# Patient Record
Sex: Female | Born: 1975 | Hispanic: No | Marital: Married | Smoking: Current every day smoker
Health system: Southern US, Community
[De-identification: ages and names within clinical notes are randomized; demographics above are authoritative.]

## PROBLEM LIST (undated history)

## (undated) DIAGNOSIS — O009 Unspecified ectopic pregnancy without intrauterine pregnancy: Secondary | ICD-10-CM

## (undated) DIAGNOSIS — F329 Major depressive disorder, single episode, unspecified: Secondary | ICD-10-CM

## (undated) DIAGNOSIS — F32A Depression, unspecified: Secondary | ICD-10-CM

## (undated) DIAGNOSIS — N390 Urinary tract infection, site not specified: Secondary | ICD-10-CM

## (undated) DIAGNOSIS — F419 Anxiety disorder, unspecified: Secondary | ICD-10-CM

## (undated) HISTORY — PX: SALPINGECTOMY: SHX328

---

## 2002-04-08 ENCOUNTER — Emergency Department (HOSPITAL_COMMUNITY): Admission: EM | Admit: 2002-04-08 | Discharge: 2002-04-08 | Payer: Self-pay | Admitting: Emergency Medicine

## 2002-06-08 ENCOUNTER — Encounter: Payer: Self-pay | Admitting: Emergency Medicine

## 2002-06-08 ENCOUNTER — Emergency Department (HOSPITAL_COMMUNITY): Admission: EM | Admit: 2002-06-08 | Discharge: 2002-06-08 | Payer: Self-pay | Admitting: Emergency Medicine

## 2002-12-26 ENCOUNTER — Emergency Department (HOSPITAL_COMMUNITY): Admission: EM | Admit: 2002-12-26 | Discharge: 2002-12-26 | Payer: Self-pay | Admitting: Emergency Medicine

## 2003-01-16 ENCOUNTER — Emergency Department (HOSPITAL_COMMUNITY): Admission: EM | Admit: 2003-01-16 | Discharge: 2003-01-17 | Payer: Self-pay | Admitting: Emergency Medicine

## 2003-04-01 ENCOUNTER — Encounter: Payer: Self-pay | Admitting: Emergency Medicine

## 2003-04-01 ENCOUNTER — Emergency Department (HOSPITAL_COMMUNITY): Admission: EM | Admit: 2003-04-01 | Discharge: 2003-04-01 | Payer: Self-pay | Admitting: Emergency Medicine

## 2003-06-23 ENCOUNTER — Emergency Department (HOSPITAL_COMMUNITY): Admission: EM | Admit: 2003-06-23 | Discharge: 2003-06-23 | Payer: Self-pay | Admitting: Emergency Medicine

## 2003-12-12 ENCOUNTER — Emergency Department (HOSPITAL_COMMUNITY): Admission: EM | Admit: 2003-12-12 | Discharge: 2003-12-13 | Payer: Self-pay | Admitting: Emergency Medicine

## 2004-01-30 ENCOUNTER — Ambulatory Visit (HOSPITAL_COMMUNITY): Admission: RE | Admit: 2004-01-30 | Discharge: 2004-01-30 | Payer: Self-pay | Admitting: Obstetrics and Gynecology

## 2004-11-20 ENCOUNTER — Emergency Department (HOSPITAL_COMMUNITY): Admission: EM | Admit: 2004-11-20 | Discharge: 2004-11-21 | Payer: Self-pay | Admitting: Emergency Medicine

## 2004-11-23 ENCOUNTER — Inpatient Hospital Stay (HOSPITAL_COMMUNITY): Admission: AD | Admit: 2004-11-23 | Discharge: 2004-11-23 | Payer: Self-pay | Admitting: Obstetrics & Gynecology

## 2004-11-24 ENCOUNTER — Inpatient Hospital Stay (HOSPITAL_COMMUNITY): Admission: AD | Admit: 2004-11-24 | Discharge: 2004-11-24 | Payer: Self-pay | Admitting: *Deleted

## 2004-12-07 ENCOUNTER — Other Ambulatory Visit: Admission: RE | Admit: 2004-12-07 | Discharge: 2004-12-07 | Payer: Self-pay | Admitting: Obstetrics and Gynecology

## 2004-12-08 ENCOUNTER — Other Ambulatory Visit: Admission: RE | Admit: 2004-12-08 | Discharge: 2004-12-08 | Payer: Self-pay | Admitting: Obstetrics and Gynecology

## 2004-12-12 ENCOUNTER — Inpatient Hospital Stay (HOSPITAL_COMMUNITY): Admission: AD | Admit: 2004-12-12 | Discharge: 2004-12-12 | Payer: Self-pay | Admitting: Obstetrics & Gynecology

## 2004-12-14 ENCOUNTER — Encounter (INDEPENDENT_AMBULATORY_CARE_PROVIDER_SITE_OTHER): Payer: Self-pay | Admitting: *Deleted

## 2004-12-14 ENCOUNTER — Inpatient Hospital Stay (HOSPITAL_COMMUNITY): Admission: AD | Admit: 2004-12-14 | Discharge: 2004-12-16 | Payer: Self-pay | Admitting: Obstetrics & Gynecology

## 2004-12-19 ENCOUNTER — Inpatient Hospital Stay (HOSPITAL_COMMUNITY): Admission: AD | Admit: 2004-12-19 | Discharge: 2004-12-19 | Payer: Self-pay | Admitting: Obstetrics & Gynecology

## 2005-03-07 ENCOUNTER — Emergency Department (HOSPITAL_COMMUNITY): Admission: EM | Admit: 2005-03-07 | Discharge: 2005-03-07 | Payer: Self-pay | Admitting: Emergency Medicine

## 2005-04-12 ENCOUNTER — Inpatient Hospital Stay (HOSPITAL_COMMUNITY): Admission: RE | Admit: 2005-04-12 | Discharge: 2005-04-19 | Payer: Self-pay | Admitting: Psychiatry

## 2005-04-13 ENCOUNTER — Ambulatory Visit: Payer: Self-pay | Admitting: Psychiatry

## 2005-05-28 ENCOUNTER — Emergency Department (HOSPITAL_COMMUNITY): Admission: EM | Admit: 2005-05-28 | Discharge: 2005-05-28 | Payer: Self-pay | Admitting: Family Medicine

## 2005-07-18 ENCOUNTER — Emergency Department (HOSPITAL_COMMUNITY): Admission: EM | Admit: 2005-07-18 | Discharge: 2005-07-19 | Payer: Self-pay | Admitting: Emergency Medicine

## 2006-06-15 ENCOUNTER — Emergency Department (HOSPITAL_COMMUNITY): Admission: EM | Admit: 2006-06-15 | Discharge: 2006-06-15 | Payer: Self-pay | Admitting: Emergency Medicine

## 2006-07-10 ENCOUNTER — Emergency Department (HOSPITAL_COMMUNITY): Admission: EM | Admit: 2006-07-10 | Discharge: 2006-07-10 | Payer: Self-pay | Admitting: Emergency Medicine

## 2007-03-01 ENCOUNTER — Emergency Department (HOSPITAL_COMMUNITY): Admission: EM | Admit: 2007-03-01 | Discharge: 2007-03-01 | Payer: Self-pay | Admitting: Family Medicine

## 2007-03-28 ENCOUNTER — Emergency Department (HOSPITAL_COMMUNITY): Admission: EM | Admit: 2007-03-28 | Discharge: 2007-03-29 | Payer: Self-pay | Admitting: Emergency Medicine

## 2008-11-04 ENCOUNTER — Emergency Department (HOSPITAL_COMMUNITY): Admission: EM | Admit: 2008-11-04 | Discharge: 2008-11-05 | Payer: Self-pay | Admitting: Emergency Medicine

## 2008-11-10 ENCOUNTER — Emergency Department (HOSPITAL_COMMUNITY): Admission: EM | Admit: 2008-11-10 | Discharge: 2008-11-10 | Payer: Self-pay | Admitting: Emergency Medicine

## 2009-02-06 ENCOUNTER — Emergency Department (HOSPITAL_COMMUNITY): Admission: EM | Admit: 2009-02-06 | Discharge: 2009-02-07 | Payer: Self-pay | Admitting: Emergency Medicine

## 2009-08-15 ENCOUNTER — Inpatient Hospital Stay (HOSPITAL_COMMUNITY): Admission: AD | Admit: 2009-08-15 | Discharge: 2009-08-15 | Payer: Self-pay | Admitting: Obstetrics & Gynecology

## 2009-08-26 ENCOUNTER — Inpatient Hospital Stay (HOSPITAL_COMMUNITY): Admission: RE | Admit: 2009-08-26 | Discharge: 2009-08-26 | Payer: Self-pay | Admitting: Obstetrics & Gynecology

## 2009-12-05 ENCOUNTER — Ambulatory Visit: Payer: Self-pay | Admitting: Physician Assistant

## 2009-12-05 ENCOUNTER — Inpatient Hospital Stay (HOSPITAL_COMMUNITY): Admission: AD | Admit: 2009-12-05 | Discharge: 2009-12-06 | Payer: Self-pay | Admitting: Obstetrics & Gynecology

## 2010-02-18 ENCOUNTER — Ambulatory Visit: Payer: Self-pay | Admitting: Obstetrics & Gynecology

## 2010-02-18 ENCOUNTER — Inpatient Hospital Stay (HOSPITAL_COMMUNITY): Admission: AD | Admit: 2010-02-18 | Discharge: 2010-02-18 | Payer: Self-pay | Admitting: Obstetrics and Gynecology

## 2010-03-13 ENCOUNTER — Inpatient Hospital Stay (HOSPITAL_COMMUNITY): Admission: AD | Admit: 2010-03-13 | Discharge: 2010-03-14 | Payer: Self-pay | Admitting: Obstetrics and Gynecology

## 2010-03-20 ENCOUNTER — Inpatient Hospital Stay (HOSPITAL_COMMUNITY): Admission: AD | Admit: 2010-03-20 | Discharge: 2010-03-20 | Payer: Self-pay | Admitting: Obstetrics & Gynecology

## 2010-04-02 ENCOUNTER — Inpatient Hospital Stay (HOSPITAL_COMMUNITY): Admission: AD | Admit: 2010-04-02 | Discharge: 2010-04-02 | Payer: Self-pay | Admitting: Obstetrics and Gynecology

## 2010-04-08 ENCOUNTER — Inpatient Hospital Stay (HOSPITAL_COMMUNITY): Admission: AD | Admit: 2010-04-08 | Discharge: 2010-04-08 | Payer: Self-pay | Admitting: Family Medicine

## 2010-04-14 ENCOUNTER — Inpatient Hospital Stay (HOSPITAL_COMMUNITY): Admission: RE | Admit: 2010-04-14 | Discharge: 2010-04-17 | Payer: Self-pay | Admitting: Obstetrics & Gynecology

## 2010-08-09 ENCOUNTER — Encounter: Payer: Self-pay | Admitting: Obstetrics and Gynecology

## 2010-08-09 ENCOUNTER — Emergency Department (HOSPITAL_COMMUNITY)
Admission: EM | Admit: 2010-08-09 | Discharge: 2010-08-09 | Payer: Self-pay | Source: Home / Self Care | Admitting: Emergency Medicine

## 2010-08-10 ENCOUNTER — Encounter: Payer: Self-pay | Admitting: Obstetrics and Gynecology

## 2010-10-01 LAB — SURGICAL PCR SCREEN
MRSA, PCR: NEGATIVE
Staphylococcus aureus: POSITIVE — AB

## 2010-10-01 LAB — CBC
Hemoglobin: 12.5 g/dL (ref 12.0–15.0)
MCV: 95.7 fL (ref 78.0–100.0)
Platelets: 247 10*3/uL (ref 150–400)
RBC: 3.07 MIL/uL — ABNORMAL LOW (ref 3.87–5.11)
RBC: 3.79 MIL/uL — ABNORMAL LOW (ref 3.87–5.11)
RDW: 13.4 % (ref 11.5–15.5)
WBC: 11.5 10*3/uL — ABNORMAL HIGH (ref 4.0–10.5)

## 2010-10-01 LAB — URINALYSIS, ROUTINE W REFLEX MICROSCOPIC
Bilirubin Urine: NEGATIVE
Glucose, UA: NEGATIVE mg/dL
Hgb urine dipstick: NEGATIVE
Ketones, ur: NEGATIVE mg/dL
Protein, ur: NEGATIVE mg/dL
pH: 6 (ref 5.0–8.0)

## 2010-10-01 LAB — STREP B DNA PROBE: Strep Group B Ag: NEGATIVE

## 2010-10-01 LAB — RH IMMUNE GLOB WKUP(>/=20WKS)(NOT WOMEN'S HOSP): Fetal Screen: NEGATIVE

## 2010-10-01 LAB — WET PREP, GENITAL: Clue Cells Wet Prep HPF POC: NONE SEEN

## 2010-10-01 LAB — TYPE AND SCREEN: DAT, IgG: NEGATIVE

## 2010-10-02 LAB — FETAL FIBRONECTIN: Fetal Fibronectin: NEGATIVE

## 2010-10-02 LAB — CBC
Hemoglobin: 11.7 g/dL — ABNORMAL LOW (ref 12.0–15.0)
MCH: 33.7 pg (ref 26.0–34.0)
MCHC: 34.8 g/dL (ref 30.0–36.0)
MCV: 97 fL (ref 78.0–100.0)
Platelets: 297 10*3/uL (ref 150–400)
RBC: 3.46 MIL/uL — ABNORMAL LOW (ref 3.87–5.11)

## 2010-10-02 LAB — URINALYSIS, ROUTINE W REFLEX MICROSCOPIC
Bilirubin Urine: NEGATIVE
Bilirubin Urine: NEGATIVE
Hgb urine dipstick: NEGATIVE
Hgb urine dipstick: NEGATIVE
Ketones, ur: 15 mg/dL — AB
Ketones, ur: NEGATIVE mg/dL
Nitrite: NEGATIVE
Nitrite: POSITIVE — AB
Specific Gravity, Urine: >1.03 — ABNORMAL HIGH (ref 1.005–1.030)
Urobilinogen, UA: 1 mg/dL (ref 0.0–1.0)
pH: 6 (ref 5.0–8.0)

## 2010-10-02 LAB — URINE MICROSCOPIC-ADD ON

## 2010-10-04 LAB — WET PREP, GENITAL: Yeast Wet Prep HPF POC: NONE SEEN

## 2010-10-04 LAB — URINALYSIS, ROUTINE W REFLEX MICROSCOPIC
Bilirubin Urine: NEGATIVE
Ketones, ur: NEGATIVE mg/dL
Nitrite: NEGATIVE
Protein, ur: NEGATIVE mg/dL
Urobilinogen, UA: 1 mg/dL (ref 0.0–1.0)

## 2010-10-04 LAB — CBC
MCHC: 34.5 g/dL (ref 30.0–36.0)
MCV: 95.7 fL (ref 78.0–100.0)
Platelets: 251 10*3/uL (ref 150–400)
RDW: 13.9 % (ref 11.5–15.5)

## 2010-10-04 LAB — HCG, QUANTITATIVE, PREGNANCY: hCG, Beta Chain, Quant, S: 3208 m[IU]/mL — ABNORMAL HIGH (ref <5)

## 2010-10-05 LAB — URINALYSIS, ROUTINE W REFLEX MICROSCOPIC
Glucose, UA: 250 mg/dL — AB
Leukocytes, UA: NEGATIVE
Protein, ur: NEGATIVE mg/dL
Specific Gravity, Urine: 1.025 (ref 1.005–1.030)
pH: 6 (ref 5.0–8.0)

## 2010-10-05 LAB — URINE CULTURE: Colony Count: >=100000

## 2010-10-05 LAB — URINE MICROSCOPIC-ADD ON

## 2010-10-05 LAB — WET PREP, GENITAL: Yeast Wet Prep HPF POC: NONE SEEN

## 2010-10-28 LAB — DIFFERENTIAL
Eosinophils Absolute: 0.1 10*3/uL (ref 0.0–0.7)
Lymphocytes Relative: 42 % (ref 12–46)
Lymphs Abs: 2.9 10*3/uL (ref 0.7–4.0)
Monocytes Relative: 6 % (ref 3–12)
Neutrophils Relative %: 51 % (ref 43–77)

## 2010-10-28 LAB — POCT I-STAT, CHEM 8
Calcium, Ion: 1.13 mmol/L (ref 1.12–1.32)
Creatinine, Ser: 0.9 mg/dL (ref 0.4–1.2)
Glucose, Bld: 92 mg/dL (ref 70–99)
Hemoglobin: 13.6 g/dL (ref 12.0–15.0)
TCO2: 25 mmol/L (ref 0–100)

## 2010-10-28 LAB — URINALYSIS, ROUTINE W REFLEX MICROSCOPIC
Glucose, UA: NEGATIVE mg/dL
Hgb urine dipstick: NEGATIVE
Nitrite: NEGATIVE
Nitrite: POSITIVE — AB
Protein, ur: NEGATIVE mg/dL
Protein, ur: NEGATIVE mg/dL
Urobilinogen, UA: 1 mg/dL (ref 0.0–1.0)
Urobilinogen, UA: 1 mg/dL (ref 0.0–1.0)

## 2010-10-28 LAB — WET PREP, GENITAL
Trich, Wet Prep: NONE SEEN
WBC, Wet Prep HPF POC: NONE SEEN
Yeast Wet Prep HPF POC: NONE SEEN

## 2010-10-28 LAB — CBC
MCV: 95.9 fL (ref 78.0–100.0)
RBC: 4.08 MIL/uL (ref 3.87–5.11)
WBC: 6.9 10*3/uL (ref 4.0–10.5)

## 2010-10-28 LAB — URINE CULTURE

## 2010-11-07 ENCOUNTER — Emergency Department (HOSPITAL_COMMUNITY)
Admission: EM | Admit: 2010-11-07 | Discharge: 2010-11-08 | Disposition: A | Discharge status: Home / Self Care | Payer: Medicaid Other | Attending: Emergency Medicine | Admitting: Emergency Medicine

## 2010-11-07 DIAGNOSIS — R0789 Other chest pain: Secondary | ICD-10-CM | POA: Insufficient documentation

## 2010-11-07 DIAGNOSIS — F341 Dysthymic disorder: Secondary | ICD-10-CM | POA: Insufficient documentation

## 2010-11-07 DIAGNOSIS — G43909 Migraine, unspecified, not intractable, without status migrainosus: Secondary | ICD-10-CM | POA: Insufficient documentation

## 2010-11-08 LAB — BASIC METABOLIC PANEL
BUN: 10 mg/dL (ref 6–23)
CO2: 26 mEq/L (ref 19–32)
Calcium: 9.1 mg/dL (ref 8.4–10.5)
GFR calc non Af Amer: >60 mL/min (ref >60)
Glucose, Bld: 87 mg/dL (ref 70–99)

## 2010-11-08 LAB — CBC
HCT: 41.8 % (ref 36.0–46.0)
MCH: 31.3 pg (ref 26.0–34.0)
MCV: 91.5 fL (ref 78.0–100.0)
Platelets: 234 10*3/uL (ref 150–400)
RDW: 13.8 % (ref 11.5–15.5)

## 2010-11-08 LAB — DIFFERENTIAL
Eosinophils Absolute: 0.1 10*3/uL (ref 0.0–0.7)
Eosinophils Relative: 1 % (ref 0–5)
Lymphocytes Relative: 36 % (ref 12–46)
Lymphs Abs: 2.9 10*3/uL (ref 0.7–4.0)
Monocytes Absolute: 0.4 10*3/uL (ref 0.1–1.0)
Monocytes Relative: 5 % (ref 3–12)

## 2010-11-26 ENCOUNTER — Other Ambulatory Visit: Payer: Self-pay | Admitting: Internal Medicine

## 2010-11-26 ENCOUNTER — Ambulatory Visit
Admission: RE | Admit: 2010-11-26 | Discharge: 2010-11-26 | Disposition: A | Discharge status: Home / Self Care | Payer: Medicaid Other | Source: Ambulatory Visit | Attending: Internal Medicine | Admitting: Internal Medicine

## 2010-11-26 DIAGNOSIS — R52 Pain, unspecified: Secondary | ICD-10-CM

## 2010-12-04 NOTE — Discharge Summary (Signed)
Natalie Barrera, Natalie Barrera              ACCOUNT NO.:  [id number]   MEDICAL RECORD NO.:  [id number]          PATIENT TYPE:  INP   LOCATION:  9306                          FACILITY:  [facility]   PHYSICIAN:  Gerrit Friends. Aldona Bar, M.D.   DATE OF BIRTH:  1975/11/09   DATE OF ADMISSION:  12/14/2004  DATE OF DISCHARGE:                                 DISCHARGE SUMMARY   DISCHARGE DIAGNOSES:  1.  Ruptured left tubal ectopic pregnancy with hemoperitoneum.  2.  Blood type Rh negative - RhoGAM previously given.   PROCEDURE:  Exploratory laparotomy, left salpingectomy.   HISTORY:  This 35 year old gravida 3 para 2 was been followed essentially  for about three-and-half weeks with a suspected ectopic pregnancy. She  received methotrexate on Dec 12, 2004. She presented to the maternity  admissions area on the morning of May 29 with an acute abdomen and an  ultrasound showed a hemoperitoneum although small with a ectopic pregnancy  in the left fallopian tube. She also had a 6-7 cm corpus luteum on the right  ovary. She was taken to the operating room for an exploratory laparotomy  through a small Pfannenstiel incision. She underwent a left salpingectomy,  evacuation of hemoperitoneum, and during the procedure the right corpus  luteum cyst was ruptured and required no other treatment. The fluid in the  cyst was clear and the cyst wall on the interior was free of any  excrescences. Final pathology report on the fallopian tube is still pending  at the time of discharge. On the morning of May 31, the patient was  ambulating well, tolerating a regular diet well, her abdomen was soft, bowel  sounds were normal, wound was clean and dry, she was tolerating a regular  diet well, her vital signs were stable, and she was desirous of discharge.  Accordingly, she was given all appropriate instructions. Prescriptions  include Tylox one to two q.4-6h. as needed for severe pain and Motrin 600 mg  q.6h. as needed for less severe  pain. She will return to the maternity  admissions area on June 3 to have her staples removed and her wound Steri-  Stripped with Benzoin. She will return to see me in the office in  approximately 2 weeks' time.   CONDITION ON DISCHARGE:  Improved.      RMW/MEDQ  D:  12/16/2004  T:  12/16/2004  Job:  [id number]

## 2010-12-04 NOTE — Discharge Summary (Signed)
Natalie Barrera, Natalie Barrera              ACCOUNT NO.:  [id number]   MEDICAL RECORD NO.:  [id number]          PATIENT TYPE:  IPS   LOCATION:  0303                          FACILITY:  [facility]   PHYSICIAN:  Jeanice Lim, M.D. DATE OF BIRTH:  11-18-1975   DATE OF ADMISSION:  04/12/2005  DATE OF DISCHARGE:  04/19/2005                                 DISCHARGE SUMMARY   IDENTIFYING DATA:  This is a 35 year old single Caucasian female voluntarily  admitted reporting I can't take it anymore.  Ectopic pregnancy 2 months  ago, unable to tolerate noise or activity at home with sister and 4 children  in the home.  Endorsed 3-4 weeks of panic attacks and depressive symptoms  and rapid mood swings.   MEDICATIONS:  None.   ALLERGIES:  No known drug allergies.   PHYSICAL EXAMINATION:  Physical and neurologic exam within normal limits.   MENTAL STATUS EXAM:  Fully alert, teary, cooperative without affective  blunting.  Poor eye contact.  Speech normal.  Mood depressed, suicidal  thoughts.  Thought process goal directed, no psychotic symptoms.  Cognitively intact.  Judgment and insight were impaired.   ADMISSION DIAGNOSES:  AXIS I:  Panic disorder without agoraphobia.  Likely  bipolar disorder not otherwise specified.  AXIS II:  No diagnosis.  AXIS III:  Hearing deficit.  AXIS IV:  Moderate stressors including relationship issues, limited support  system, home being overcrowded.  AXIS V:  35/65.   HOSPITAL COURSE:  Patient was admitted, ordered routine p.r.n. medications,  underwent further monitoring, was encouraged to participate in individual,  group and milieu therapy.  The patient was agreeable to use birth control or  condoms to be able to be placed on Depakote after risk/benefit ratio and  alternative treatments were discussed, and patient's mood was stabilized.  A  family session was held putting in place an aftercare plan.  Patient  reported positive response to medication changes and  gradual stabilization  of mood, resolution of psychotic symptoms and improved sleep, appetite and  impulse control.  Patient was discharged in improved condition with no  psychotic symptoms, mood stable, no dangerous ideation and aware of need for  birth control being on Depakote.  Patient was given medication education and  discharged again at time of discharge.   DISCHARGE MEDICATIONS:  1.  Paxil CR 12.5 mg q.a.m.  2.  Risperdal 0.5 mg 1/2 tablet at 2:00 p.m. and 2 tablets q.h.s.  3.  Klonopin 0.5 mg 1 at 9:00 a.m., 2:00 p.m. and bedtime.  4.  Depakote ER 250 mg 1 in the morning and 2 at night.  Patient's last      Depakote level was 66.6 on March 15, 2005.  5.  Trazodone 100 mg 1-1/2 tablets q.h.s. p.r.n. insomnia.   FOLLOW UP:  Discharge followup at [facility] on Wednesday,  April 21, 2005, at 4:00 p.m.   DISCHARGE DIAGNOSES:  AXIS I:  Panic disorder without agoraphobia.  Likely  bipolar disorder not otherwise specified.  AXIS II:  No diagnosis.  AXIS III:  Hearing deficit.  AXIS IV:  Moderate  stressors including relationship issues, limited support  system, home being overcrowded.  AXIS V:  Global assessment of function on discharge 55-60.      Jeanice Lim, M.D.  Electronically Signed     JEM/MEDQ  D:  05/21/2005  T:  05/21/2005  Job:  [id number]

## 2010-12-04 NOTE — Op Note (Signed)
NAMELYNE, KHURANA              ACCOUNT NO.:  [id number]   MEDICAL RECORD NO.:  [id number]          PATIENT TYPE:  AMB   LOCATION:  SDC                           FACILITY:  [facility]   PHYSICIAN:  Gerrit Friends. Aldona Bar, M.D.   DATE OF BIRTH:  05/13/1976   DATE OF PROCEDURE:  12/14/2004  DATE OF DISCHARGE:                                 OPERATIVE REPORT   PREOPERATIVE DIAGNOSIS:  Ectopic pregnancy, left fallopian tube, with  intraabdominal hemorrhage, blood type O negative, 6 cm ? corpus luteum right  ovary.   POSTOPERATIVE DIAGNOSIS:  Ectopic pregnancy, left fallopian tube, with  intraabdominal hemorrhage, blood type O negative, 6 cm ? corpus luteum right  ovary, pathology pending.   PROCEDURE:  Exploratory laparotomy, evacuation of hemoperitoneum, and left  salpingectomy.   SURGEON:  Gerrit Friends. Aldona Bar, M.D.   ANESTHESIA:  General endotracheal.   HISTORY:  This 35 year old gravida 3 para 2 (status post two cesarean  sections) was seen in early May 2006 for a urinary tract infection and  diagnosed as well at that time with a pregnancy.  Essentially for the past  three weeks she has been followed intermittently with the suspicion for an  ectopic pregnancy, and on Dec 12, 2004 did receive a dose of methotrexate as  well as a dose of intramuscular Toradol as well as a prescription for oral  Toradol for treatment of a suspected ectopic pregnancy.  She returned to  triage on the morning of Dec 14, 2004 - brought in by EMS with severe lower  abdominal pain of recent onset.  She related abdominal pain off and on  essentially for two or three days, but apparently early this morning began  having increased lower abdominal discomfort to the point where the EMS was  called to bring her to the admissions area.  She recently had taken a dose  of oral Toradol prior to her arrival in the emergency area.   When seen in the triage area by me, her vital signs were stable, although  she did have an acute  abdomen.  She appeared slightly distended.  Her bowel  sounds were present but maybe slightly reduced.  She was extremely  uncomfortable.  I did an ultrasound on her which suggested a decidual cast  inside the uterus, consistent with the vaginal bleeding that she was having.  There was a 6 cm simple-appearing cyst (corpus luteum?) on the right ovary.  There was some free fluid noted in the pelvis.  A formal ultrasound was  obtained essentially with similar findings except that the left ovary was  delineated as well as a suspicious area adjacent to the left ovary  consistent with what was probably the ectopic pregnancy, and there was some  free fluid in this area as well.  The patient is now taken to the operating  room for a laparotomy through a mini Pfannenstiel incision for evacuation of  a small hemoperitoneum as well as treatment of her ectopic pregnancy which  presumably is located in the left tube adjacent to the left ovary.   OPERATIVE PROCEDURE:  The  patient was taken to the operating room, where  after the satisfactory induction of general endotracheal anesthesia she was  prepped and draped in the usual fashion for a laparotomy, having been placed  in a supine position.  A Foley catheter was inserted as part of the prep.  Once the patient was adequately draped, a Pfannenstiel incision was made  through the old scar and dissected down sharply to and through the fascia in  a low transverse fashion.  Muscles were separated in the midline.  Peritoneum identified and entered appropriately, with care taken to avoid  the bowel superiorly and the bladder inferiorly.  At this time, a  hemoperitoneum was encountered and suctioned, the amount of which was  probably between 50-100 cc.  The self-retaining Balfour retractor was  placed, and the bowel was packed off, and visualization of the left  fallopian tube was carried out - there was an ectopic pregnancy in what  appeared to be the  fimbriated portion of the left fallopian tube, with some  hemorrhage occurring out the fimbria.  The ectopic measured approximately 1  cm or so in diameter.  The left ovary appeared normal.  The right ovary did  have a large simple cyst which in the process of evaluation was ruptured -  clear fluid - and upon further evaluation the cyst walls were free of any  excrescences, and the cyst was not bleeding.  Therefore, the cyst was left  alone after the ruptured had occurred.  The right fallopian tube appeared  normal.   At this time, the left fallopian tube was dissected free and removed using  Kelly clamps and suture ligature of 0 Vicryl.  The site of the left  salpingectomy was dry.  Again, the pelvis was profusely irrigated and  appeared totally normal including the aforementioned ruptured cyst emanating  in the right ovary.  Procedure at this time was felt to be complete and was  terminated.  All counts were correct.  The Balfour retractor was removed.  There were no foreign bodies remaining in the abdominal cavity and, all  counts being correct, closure of the abdomen was carried out in layers.  The  abdominal peritoneum was closed with 2-0 Vicryl in a running fashion, and  the muscles secured with the same.  Assured of good subfascial hemostasis,  the fascia was then reapproximated using 0 Vicryl from angle to midline  bilaterally.  Subcutaneous tissue was rendered hemostatic, and staples were  then used to close the skin.  A sterile pressure dressing was applied, and  the patient at this time was taken to the recovery room in satisfactory  condition, having tolerated the procedure well.   Intraoperative findings included an ectopic pregnancy in the fimbriated  portion of the left fallopian tube, with hemorrhage out the fimbria, with  approximately 50-100 cc of clot and free blood in the pelvis.  There was a simple corpus luteum on the right ovary which in the process of evaluation   spontaneously ruptured, with clear fluid, and the cyst walls looked clean,  and no bleeding was noted, and therefore no sutures were employed.  The  right fallopian tube appeared totally normal in its course.  The left ovary  likewise appeared normal.   In summary, this patient had a leaking ectopic pregnancy with hemoperitoneum  and was taken to the operating room for a left salpingectomy and evacuation  of the hemoperitoneum.  The patient was taken to the recovery room in  satisfactory condition, having tolerated the procedure well.  All counts  were correct x 2.  Pathologic specimen consisted of left fallopian tube.      RMW/MEDQ  D:  12/14/2004  T:  12/14/2004  Job:  [id number]

## 2011-03-29 ENCOUNTER — Emergency Department (HOSPITAL_COMMUNITY)
Admission: EM | Admit: 2011-03-29 | Discharge: 2011-03-29 | Disposition: A | Discharge status: Home / Self Care | Payer: Medicaid Other | Attending: Emergency Medicine | Admitting: Emergency Medicine

## 2011-03-29 DIAGNOSIS — K047 Periapical abscess without sinus: Secondary | ICD-10-CM | POA: Insufficient documentation

## 2011-03-29 DIAGNOSIS — F341 Dysthymic disorder: Secondary | ICD-10-CM | POA: Insufficient documentation

## 2011-03-29 DIAGNOSIS — K089 Disorder of teeth and supporting structures, unspecified: Secondary | ICD-10-CM | POA: Insufficient documentation

## 2011-04-08 ENCOUNTER — Other Ambulatory Visit: Payer: Self-pay | Admitting: Internal Medicine

## 2011-04-08 DIAGNOSIS — M25511 Pain in right shoulder: Secondary | ICD-10-CM

## 2011-04-09 ENCOUNTER — Ambulatory Visit
Admission: RE | Admit: 2011-04-09 | Discharge: 2011-04-09 | Disposition: A | Discharge status: Home / Self Care | Payer: Medicaid Other | Source: Ambulatory Visit | Attending: Internal Medicine | Admitting: Internal Medicine

## 2011-04-09 DIAGNOSIS — M25511 Pain in right shoulder: Secondary | ICD-10-CM

## 2011-04-11 ENCOUNTER — Other Ambulatory Visit: Payer: Medicaid Other

## 2011-04-30 LAB — CBC
HCT: 40.6
Hemoglobin: 14.2
MCHC: 35
Platelets: 251
RDW: 13.4

## 2011-04-30 LAB — DIFFERENTIAL
Basophils Absolute: 0
Basophils Relative: 0
Eosinophils Relative: 1
Lymphocytes Relative: 39
Monocytes Absolute: 0.4

## 2011-04-30 LAB — I-STAT 8, (EC8 V) (CONVERTED LAB)
BUN: 8
Chloride: 104
Glucose, Bld: 87
Hemoglobin: 15
Potassium: 4.4
Sodium: 137
pH, Ven: 7.39 — ABNORMAL HIGH

## 2011-04-30 LAB — URINALYSIS, ROUTINE W REFLEX MICROSCOPIC
Glucose, UA: NEGATIVE
Ketones, ur: NEGATIVE
Leukocytes, UA: NEGATIVE
Nitrite: POSITIVE — AB
Protein, ur: NEGATIVE
Urobilinogen, UA: 1

## 2011-04-30 LAB — GC/CHLAMYDIA PROBE AMP, GENITAL: Chlamydia, DNA Probe: NEGATIVE

## 2011-04-30 LAB — WET PREP, GENITAL: Yeast Wet Prep HPF POC: NONE SEEN

## 2011-04-30 LAB — POCT I-STAT CREATININE
Creatinine, Ser: 0.6
Operator id: 272551

## 2011-04-30 LAB — URINE MICROSCOPIC-ADD ON

## 2011-04-30 LAB — POCT PREGNANCY, URINE: Preg Test, Ur: NEGATIVE

## 2011-07-02 ENCOUNTER — Emergency Department (HOSPITAL_COMMUNITY)
Admission: EM | Admit: 2011-07-02 | Discharge: 2011-07-02 | Disposition: A | Discharge status: Home / Self Care | Payer: Medicaid Other | Attending: Emergency Medicine | Admitting: Emergency Medicine

## 2011-07-02 ENCOUNTER — Encounter: Payer: Self-pay | Admitting: Emergency Medicine

## 2011-07-02 DIAGNOSIS — F411 Generalized anxiety disorder: Secondary | ICD-10-CM | POA: Insufficient documentation

## 2011-07-02 DIAGNOSIS — R05 Cough: Secondary | ICD-10-CM | POA: Insufficient documentation

## 2011-07-02 DIAGNOSIS — IMO0001 Reserved for inherently not codable concepts without codable children: Secondary | ICD-10-CM | POA: Insufficient documentation

## 2011-07-02 DIAGNOSIS — R059 Cough, unspecified: Secondary | ICD-10-CM | POA: Insufficient documentation

## 2011-07-02 DIAGNOSIS — H9209 Otalgia, unspecified ear: Secondary | ICD-10-CM | POA: Insufficient documentation

## 2011-07-02 DIAGNOSIS — Z79899 Other long term (current) drug therapy: Secondary | ICD-10-CM | POA: Insufficient documentation

## 2011-07-02 DIAGNOSIS — J029 Acute pharyngitis, unspecified: Secondary | ICD-10-CM | POA: Insufficient documentation

## 2011-07-02 DIAGNOSIS — R509 Fever, unspecified: Secondary | ICD-10-CM | POA: Insufficient documentation

## 2011-07-02 HISTORY — DX: Anxiety disorder, unspecified: F41.9

## 2011-07-02 LAB — RAPID STREP SCREEN (MED CTR MEBANE ONLY): Streptococcus, Group A Screen (Direct): NEGATIVE

## 2011-07-02 MED ORDER — IBUPROFEN 800 MG PO TABS: 800.0000 mg | ORAL_TABLET | Freq: Three times a day (TID) | ORAL | Status: AC

## 2011-07-02 MED ORDER — HYDROCODONE-ACETAMINOPHEN 7.5-500 MG/15ML PO SOLN: 15.0000 mL | Freq: Four times a day (QID) | ORAL | Status: AC | PRN

## 2011-07-02 NOTE — ED Notes (Signed)
Onset one day ago body chills sore throat headache and today spit up scant blood today in am. Airway intact ax4

## 2011-07-02 NOTE — ED Notes (Signed)
Patient states one day ago sore throat with pain radiating to right ear, right neck and headache. Pain 6/10 sore throbbing. Today coughed spit up bright red scant blood.  Airway intact bilateral equal chest rise and fall. Patient currently holding 14 month baby without incident no distress.

## 2011-07-02 NOTE — ED Provider Notes (Signed)
History     CSN: [id number] Arrival date & time: 07/02/2011 10:13 AM   First MD Initiated Contact with Patient 07/02/11 1153      Chief Complaint  Patient presents with  . Sore Throat    (Consider location/radiation/quality/duration/timing/severity/associated sxs/prior treatment) HPI History provided by pt.   Pt presents w/ sore throat since yesterday.  Pain radiates to right ear.  Spit out a small amt of bright red blood yesterday. Associated w/ mild cough, fever yesterday (temp 101.3) and body aches.  Denies nasal congestion, rhinorrhea, dysphagia, N/V/D, abd pain and rash.  Both children recently diagnosed w/ influenza.    Past Medical History  Diagnosis Date  . Anxiety     History reviewed. No pertinent past surgical history.  No family history on file.  History  Substance Use Topics  . Smoking status: Never Smoker   . Smokeless tobacco: Not on file  . Alcohol Use: No    OB History    Grav Para Term Preterm Abortions TAB SAB Ect Mult Living                  Review of Systems  All other systems reviewed and are negative.    Allergies  Review of patient's allergies indicates no known allergies.  Home Medications   Current Outpatient Rx  Name Route Sig Dispense Refill  . ALPRAZOLAM 0.5 MG PO TABS Oral Take 0.5 mg by mouth 3 (three) times daily.      Marland Kitchen HYDROCODONE-ACETAMINOPHEN 7.5-500 MG/15ML PO SOLN Oral Take 15 mLs by mouth every 6 (six) hours as needed for pain. 120 mL 0  . IBUPROFEN 800 MG PO TABS Oral Take 1 tablet (800 mg total) by mouth 3 (three) times daily. 12 tablet 0    BP 103/78  Pulse 97  Temp(Src) 97.9 F (36.6 C) (Oral)  Resp 14  SpO2 97%  Physical Exam  Nursing note and vitals reviewed. Constitutional: She is oriented to person, place, and time. She appears well-developed and well-nourished. No distress.  HENT:  Head: No trismus in the jaw.  Right Ear: Tympanic membrane, external ear and ear canal normal.  Left Ear: Tympanic  membrane, external ear and ear canal normal.  Mouth/Throat: Uvula is midline and mucous membranes are normal. No posterior oropharyngeal edema or posterior oropharyngeal erythema.       Mild erythema tonsils and posterior pharynx.  Small amt of exudate on left tonsil.    Eyes:       Normal appearance  Neck: Normal range of motion. Neck supple.  Cardiovascular: Normal rate and regular rhythm.   Pulmonary/Chest: Effort normal and breath sounds normal.  Lymphadenopathy:    She has no cervical adenopathy.  Neurological: She is alert and oriented to person, place, and time.  Skin: Skin is warm and dry. No rash noted.  Psychiatric: She has a normal mood and affect. Her behavior is normal.    ED Course  Procedures (including critical care time)   Labs Reviewed  RAPID STREP SCREEN   No results found.   1. Viral pharyngitis       MDM  Healthy 35yo F presents w/ c/o sore throat.  Strep screen obtained and neg.  Will treat pt conservatively for viral pharyngitis.  Prescribed lortab elixir and ibuprofen.  Return precautions discussed.         Otilio Miu, [place] 07/02/11 2031

## 2011-07-03 NOTE — ED Provider Notes (Signed)
Medical screening examination/treatment/procedure(s) were performed by non-physician practitioner and as supervising physician I was immediately available for consultation/collaboration.   Nat Christen, MD 07/03/11 [phone]

## 2012-06-12 ENCOUNTER — Encounter (HOSPITAL_COMMUNITY): Payer: Self-pay | Admitting: *Deleted

## 2012-06-12 ENCOUNTER — Emergency Department (HOSPITAL_COMMUNITY)
Admission: EM | Admit: 2012-06-12 | Discharge: 2012-06-12 | Disposition: A | Discharge status: Home / Self Care | Payer: Medicaid Other | Attending: Emergency Medicine | Admitting: Emergency Medicine

## 2012-06-12 DIAGNOSIS — N390 Urinary tract infection, site not specified: Secondary | ICD-10-CM | POA: Insufficient documentation

## 2012-06-12 DIAGNOSIS — Z79899 Other long term (current) drug therapy: Secondary | ICD-10-CM | POA: Insufficient documentation

## 2012-06-12 DIAGNOSIS — F17200 Nicotine dependence, unspecified, uncomplicated: Secondary | ICD-10-CM | POA: Insufficient documentation

## 2012-06-12 DIAGNOSIS — F411 Generalized anxiety disorder: Secondary | ICD-10-CM | POA: Insufficient documentation

## 2012-06-12 DIAGNOSIS — M549 Dorsalgia, unspecified: Secondary | ICD-10-CM | POA: Insufficient documentation

## 2012-06-12 DIAGNOSIS — R300 Dysuria: Secondary | ICD-10-CM | POA: Insufficient documentation

## 2012-06-12 HISTORY — DX: Unspecified ectopic pregnancy without intrauterine pregnancy: O00.90

## 2012-06-12 LAB — CBC WITH DIFFERENTIAL/PLATELET
Basophils Absolute: 0 10*3/uL (ref 0.0–0.1)
Basophils Relative: 0 % (ref 0–1)
Eosinophils Absolute: 0 10*3/uL (ref 0.0–0.7)
Eosinophils Relative: 0 % (ref 0–5)
HCT: 40.8 % (ref 36.0–46.0)
Hemoglobin: 14.4 g/dL (ref 12.0–15.0)
Lymphocytes Relative: 22 % (ref 12–46)
Lymphs Abs: 2.1 10*3/uL (ref 0.7–4.0)
MCH: 32 pg (ref 26.0–34.0)
MCHC: 35.3 g/dL (ref 30.0–36.0)
MCV: 90.7 fL (ref 78.0–100.0)
Monocytes Absolute: 0.6 10*3/uL (ref 0.1–1.0)
Monocytes Relative: 6 % (ref 3–12)
Neutro Abs: 6.8 10*3/uL (ref 1.7–7.7)
Neutrophils Relative %: 72 % (ref 43–77)
Platelets: 312 10*3/uL (ref 150–400)
RBC: 4.5 MIL/uL (ref 3.87–5.11)
RDW: 13.4 % (ref 11.5–15.5)
WBC: 9.5 10*3/uL (ref 4.0–10.5)

## 2012-06-12 LAB — BASIC METABOLIC PANEL
BUN: 6 mg/dL (ref 6–23)
CO2: 22 mEq/L (ref 19–32)
Calcium: 9.2 mg/dL (ref 8.4–10.5)
Chloride: 101 mEq/L (ref 96–112)
Creatinine, Ser: 0.52 mg/dL (ref 0.50–1.10)
GFR calc Af Amer: >90 mL/min (ref >90)
GFR calc non Af Amer: >90 mL/min (ref >90)
Glucose, Bld: 93 mg/dL (ref 70–99)
Potassium: 4 mEq/L (ref 3.5–5.1)
Sodium: 136 mEq/L (ref 135–145)

## 2012-06-12 LAB — URINE MICROSCOPIC-ADD ON

## 2012-06-12 LAB — URINALYSIS, ROUTINE W REFLEX MICROSCOPIC
Bilirubin Urine: NEGATIVE
Glucose, UA: NEGATIVE mg/dL
Ketones, ur: NEGATIVE mg/dL
Nitrite: POSITIVE — AB
Protein, ur: 100 mg/dL — AB
Specific Gravity, Urine: 1.023 (ref 1.005–1.030)
Urobilinogen, UA: 1 mg/dL (ref 0.0–1.0)
pH: 5.5 (ref 5.0–8.0)

## 2012-06-12 LAB — POCT PREGNANCY, URINE: Preg Test, Ur: NEGATIVE

## 2012-06-12 MED ORDER — ONDANSETRON HCL 4 MG PO TABS: 4.0000 mg | ORAL_TABLET | Freq: Four times a day (QID) | ORAL | Status: DC

## 2012-06-12 MED ORDER — CEPHALEXIN 500 MG PO CAPS: 500.0000 mg | ORAL_CAPSULE | Freq: Four times a day (QID) | ORAL | Status: DC

## 2012-06-12 NOTE — ED Provider Notes (Signed)
History     CSN: [id number]  Arrival date & time 06/12/12  1318   First MD Initiated Contact with Patient 06/12/12 1405      Chief Complaint  Patient presents with  . Back Pain  . Hematuria    (Consider location/radiation/quality/duration/timing/severity/associated sxs/prior treatment) Patient is a 36 y.o. female presenting with back pain and hematuria. The history is provided by the patient and a friend. No language interpreter was used.  Back Pain  This is a new problem. The current episode started 12 to 24 hours ago. The problem occurs constantly. The problem has been gradually worsening. The pain is at a severity of 7/10. The pain is moderate. Associated symptoms include dysuria. Pertinent negatives include no fever, no abdominal pain, no perianal numbness, no bladder incontinence, no pelvic pain, no paresthesias and no weakness. She has tried analgesics for the symptoms.  Hematuria Associated symptoms include dysuria. Pertinent negatives include no abdominal pain, fever, nausea or vomiting.  36 yo female with c/o dysuria x 24 hours that is worsening and R cva tenderness.  States that it feels like UTI with hematuria as well. No n/v or fever.  States she is on no birth control and her lmp was 05/22/12.  She is breastfeeding a 36 year old.  No pmh of kidney stones.    Past Medical History  Diagnosis Date  . Anxiety   . Ectopic pregnancy     Past Surgical History  Procedure Date  . Cesarean section     No family history on file.  History  Substance Use Topics  . Smoking status: Current Every Day Smoker  . Smokeless tobacco: Not on file  . Alcohol Use: No    OB History    Grav Para Term Preterm Abortions TAB SAB Ect Mult Living                  Review of Systems  Constitutional: Negative.  Negative for fever.  HENT: Negative.  Negative for sore throat.   Eyes: Negative.   Respiratory: Negative.  Negative for shortness of breath.   Cardiovascular: Negative.     Gastrointestinal: Negative.  Negative for nausea, vomiting and abdominal pain.  Genitourinary: Positive for dysuria and hematuria. Negative for bladder incontinence, vaginal bleeding, vaginal discharge and pelvic pain.  Musculoskeletal: Positive for back pain.  Neurological: Negative.  Negative for dizziness, weakness, light-headedness and paresthesias.  Psychiatric/Behavioral: Negative.   All other systems reviewed and are negative.    Allergies  Review of patient's allergies indicates no known allergies.  Home Medications   Current Outpatient Rx  Name  Route  Sig  Dispense  Refill  . ALPRAZOLAM 0.5 MG PO TABS   Oral   Take 0.5 mg by mouth 3 (three) times daily.             BP 117/83  Pulse 95  Temp 97.4 F (36.3 C) (Oral)  Resp 16  SpO2 97%  Physical Exam  Nursing note and vitals reviewed. Constitutional: She is oriented to person, place, and time. She appears well-developed and well-nourished.  HENT:  Head: Normocephalic and atraumatic.  Eyes: Conjunctivae normal and EOM are normal. Pupils are equal, round, and reactive to light.  Neck: Normal range of motion. Neck supple.  Cardiovascular: Normal rate.   Pulmonary/Chest: Effort normal and breath sounds normal. No respiratory distress.  Abdominal: Soft. Bowel sounds are normal. She exhibits no distension. There is no tenderness.  Genitourinary: No vaginal discharge found.  Musculoskeletal: Normal range  of motion. She exhibits no edema and no tenderness.  Neurological: She is alert and oriented to person, place, and time. She has normal reflexes.  Skin: Skin is warm and dry. No rash noted.  Psychiatric: She has a normal mood and affect.    ED Course  Procedures (including critical care time)   Labs Reviewed  CBC WITH DIFFERENTIAL  BASIC METABOLIC PANEL  POCT PREGNANCY, URINE  URINALYSIS, ROUTINE W REFLEX MICROSCOPIC   No results found.   No diagnosis found.    MDM  + UTI breastfeeding.  rx for  keflex.  Tylenol for pain.  No fever n/v.WBC 9.5.   Some L cva tenderness.  Will follow up at gyn or pcp this week.  List provided.  Urine cultured.  Patient and family understand and are ready for discharge.          Remi Haggard, NP 06/12/12 1520

## 2012-06-12 NOTE — ED Notes (Signed)
PT states vomiting and diarrhea yesterday.  Today pain with urination and hematuria with lower back pain.  Pt also reports vaginal bleeding.  LMP was 2 weeks ago.  Pt reports lower abdominal pain

## 2012-06-12 NOTE — ED Notes (Signed)
Pt states hematuria and pain with urination onset 0400 yesterday am. Pt lying comfortably in bed, nad. Pt A&Ox4, ambulatory, family at bedside.

## 2012-06-13 NOTE — ED Provider Notes (Signed)
Medical screening examination/treatment/procedure(s) were performed by non-physician practitioner and as supervising physician I was immediately available for consultation/collaboration.  Chenel Wernli, MD 06/13/12 2339 

## 2012-06-14 LAB — URINE CULTURE: Colony Count: >=100000

## 2012-07-24 ENCOUNTER — Encounter (HOSPITAL_COMMUNITY): Payer: Self-pay | Admitting: Cardiology

## 2012-07-24 ENCOUNTER — Emergency Department (HOSPITAL_COMMUNITY): Payer: Medicaid Other

## 2012-07-24 ENCOUNTER — Emergency Department (HOSPITAL_COMMUNITY)
Admission: EM | Admit: 2012-07-24 | Discharge: 2012-07-24 | Disposition: A | Discharge status: Home / Self Care | Payer: Medicaid Other | Attending: Emergency Medicine | Admitting: Emergency Medicine

## 2012-07-24 DIAGNOSIS — F17200 Nicotine dependence, unspecified, uncomplicated: Secondary | ICD-10-CM | POA: Insufficient documentation

## 2012-07-24 DIAGNOSIS — R110 Nausea: Secondary | ICD-10-CM | POA: Insufficient documentation

## 2012-07-24 DIAGNOSIS — J111 Influenza due to unidentified influenza virus with other respiratory manifestations: Secondary | ICD-10-CM | POA: Insufficient documentation

## 2012-07-24 DIAGNOSIS — F411 Generalized anxiety disorder: Secondary | ICD-10-CM | POA: Insufficient documentation

## 2012-07-24 DIAGNOSIS — Z79899 Other long term (current) drug therapy: Secondary | ICD-10-CM | POA: Insufficient documentation

## 2012-07-24 DIAGNOSIS — IMO0001 Reserved for inherently not codable concepts without codable children: Secondary | ICD-10-CM | POA: Insufficient documentation

## 2012-07-24 DIAGNOSIS — J029 Acute pharyngitis, unspecified: Secondary | ICD-10-CM | POA: Insufficient documentation

## 2012-07-24 DIAGNOSIS — J3489 Other specified disorders of nose and nasal sinuses: Secondary | ICD-10-CM | POA: Insufficient documentation

## 2012-07-24 DIAGNOSIS — R059 Cough, unspecified: Secondary | ICD-10-CM | POA: Insufficient documentation

## 2012-07-24 DIAGNOSIS — M549 Dorsalgia, unspecified: Secondary | ICD-10-CM | POA: Insufficient documentation

## 2012-07-24 DIAGNOSIS — R05 Cough: Secondary | ICD-10-CM | POA: Insufficient documentation

## 2012-07-24 DIAGNOSIS — B9789 Other viral agents as the cause of diseases classified elsewhere: Secondary | ICD-10-CM | POA: Insufficient documentation

## 2012-07-24 DIAGNOSIS — R109 Unspecified abdominal pain: Secondary | ICD-10-CM | POA: Insufficient documentation

## 2012-07-24 DIAGNOSIS — B349 Viral infection, unspecified: Secondary | ICD-10-CM

## 2012-07-24 LAB — INFLUENZA PANEL BY PCR (TYPE A & B)
H1N1 flu by pcr: DETECTED — AB
Influenza A By PCR: POSITIVE — AB

## 2012-07-24 MED ORDER — GUAIFENESIN 100 MG/5ML PO SOLN
5.0000 mL | Freq: Once | ORAL | Status: AC
  Administered 2012-07-24: 100 mg via ORAL
  Filled 2012-07-24: qty 5

## 2012-07-24 MED ORDER — LORATADINE 10 MG PO TABS
10.0000 mg | ORAL_TABLET | Freq: Once | ORAL | Status: AC
  Administered 2012-07-24: 10 mg via ORAL
  Filled 2012-07-24: qty 1

## 2012-07-24 MED ORDER — ONDANSETRON HCL 4 MG PO TABS: 4.0000 mg | ORAL_TABLET | Freq: Four times a day (QID) | ORAL | Status: DC

## 2012-07-24 MED ORDER — IBUPROFEN 600 MG PO TABS: 600.0000 mg | ORAL_TABLET | Freq: Four times a day (QID) | ORAL | Status: DC | PRN

## 2012-07-24 MED ORDER — DIPHENHYDRAMINE HCL 25 MG PO TABS: 25.0000 mg | ORAL_TABLET | Freq: Four times a day (QID) | ORAL | Status: DC

## 2012-07-24 MED ORDER — LORATADINE 10 MG PO TABS: 10.0000 mg | ORAL_TABLET | Freq: Every day | ORAL | Status: DC

## 2012-07-24 MED ORDER — IBUPROFEN 400 MG PO TABS
400.0000 mg | ORAL_TABLET | Freq: Once | ORAL | Status: AC
  Administered 2012-07-24: 400 mg via ORAL
  Filled 2012-07-24: qty 1

## 2012-07-24 NOTE — ED Provider Notes (Signed)
History   This chart was scribed for Derwood Kaplan, MD by Gerlean Ren, ED Scribe. This patient was seen in room TR04C/TR04C and the patient's care was started at 8:42 PM    CSN: [id number]  Arrival date & time 07/24/12  1705   None     Chief Complaint  Patient presents with  . Chills  . Cough     The history is provided by the patient. No language interpreter was used.   Natalie Barrera is a 37 y.o. female with h/o anxiety who presents to the Emergency Department complaining of constant diffuse mild chest pain with onset at 4:00 AM this morning with associated mild bilateral shoulder pain, mild diffuse back pain, mild upper abdominal pain, dry cough, nausea, myalgias, sore throat, chills, and fever as high as 102.  Pt reports non-bloody emesis early this morning but none since.  Pt denies congestion.  Pt states boyfriend was sick last week with a head cold.  Pt is a current everyday smoker but denies alcohol use.    Past Medical History  Diagnosis Date  . Anxiety   . Ectopic pregnancy     Past Surgical History  Procedure Date  . Cesarean section     History reviewed. No pertinent family history.  History  Substance Use Topics  . Smoking status: Current Every Day Smoker  . Smokeless tobacco: Not on file  . Alcohol Use: No    No OB history provided.   Review of Systems  Constitutional: Positive for fever and chills.  HENT: Positive for congestion and sore throat. Negative for neck stiffness.   Respiratory: Positive for cough.   Gastrointestinal: Positive for nausea and abdominal pain. Negative for vomiting.  Genitourinary: Negative for dysuria.  Musculoskeletal: Positive for myalgias and back pain.    Allergies  Review of patient's allergies indicates no known allergies.  Home Medications   Current Outpatient Rx  Name  Route  Sig  Dispense  Refill  . ALPRAZOLAM 0.5 MG PO TABS   Oral   Take 0.5 mg by mouth 3 (three) times daily.           . CEPHALEXIN 500  MG PO CAPS   Oral   Take 1 capsule (500 mg total) by mouth 4 (four) times daily.   28 capsule   0   . ONDANSETRON HCL 4 MG PO TABS   Oral   Take 1 tablet (4 mg total) by mouth every 6 (six) hours.   12 tablet   0     BP 111/60  Pulse 105  Temp 97.7 F (36.5 C) (Oral)  Resp 20  SpO2 97%  LMP 07/10/2012  Physical Exam  Nursing note and vitals reviewed. Constitutional: She is oriented to person, place, and time. She appears well-developed and well-nourished. No distress.  HENT:  Head: Normocephalic and atraumatic.       No pharyngeal erythema, no tonsillar exudate, bilateral nares transudated fluid.  Eyes:       Mild scleral injection bilaterally, making tears.  Neck: Neck supple. No tracheal deviation present.       No neck stiffness, no cervical lymphadenopathy, no preauricular or postauricular adenopathy.  Cardiovascular: Normal rate, regular rhythm and normal heart sounds.   No murmur heard. Pulmonary/Chest: Effort normal and breath sounds normal. No respiratory distress. She has no wheezes.  Abdominal: Soft. There is no tenderness.       RUQ, LUQ, and epigastric tenderness.  No lower quadrant tenderness.  Musculoskeletal: Normal  range of motion.  Neurological: She is alert and oriented to person, place, and time.  Skin: Skin is warm and dry.       Stomach, RU extremity maculopapular erythema lesions no pusutles no lfuids no tenderness  Psychiatric: She has a normal mood and affect. Her behavior is normal.    ED Course  Procedures (including critical care time) DIAGNOSTIC STUDIES: Oxygen Saturation is 97% on room air, adequate by my interpretation.    COORDINATION OF CARE: 8:48 PM- Patient informed of clinical course, understands medical decision-making process, and agrees with plan.  Ordered PO Claritin, PO Robitussin, and PO ibuprofen.   Dg Chest 2 View  07/24/2012  *RADIOLOGY REPORT*  Clinical Data: Cough and chills for 1 day.  CHEST - 2 VIEW  Comparison:  02/06/2009  Findings:  The heart size and mediastinal contours are within normal limits.  Both lungs are clear.  The visualized skeletal structures are unremarkable. Mild hyperinflation is stable.  IMPRESSION: No active cardiopulmonary disease.   Original Report Authenticated By: Davonna Belling, M.D.      No diagnosis found.    MDM  I personally performed the services described in this documentation, which was scribed in my presence. The recorded information has been reviewed and is accurate.  Pt comes in with several non specific complains. She started getting sick 2 days ago. She has URI like sx, a cough, some pleuritic type chest pain, myalgias over the thorax and back, nausea, fevers, chills.  This appears to be viral syndrome vs. Flu. Afebrile here. Understands the role of tamiflu, and is declining the rx for now. Will treat symptomatically. Warning signs discussed to make her return to the ER.        Derwood Kaplan, MD 07/24/12 2118

## 2012-07-24 NOTE — ED Notes (Addendum)
Pt reports last night she started having chills and a cough. Denies any fever or SOB. Also c/o rash to right forearm

## 2012-10-12 ENCOUNTER — Emergency Department (HOSPITAL_COMMUNITY)
Admission: EM | Admit: 2012-10-12 | Discharge: 2012-10-12 | Disposition: A | Discharge status: Home / Self Care | Payer: Medicaid Other | Attending: Emergency Medicine | Admitting: Emergency Medicine

## 2012-10-12 ENCOUNTER — Encounter (HOSPITAL_COMMUNITY): Payer: Self-pay | Admitting: Emergency Medicine

## 2012-10-12 DIAGNOSIS — R21 Rash and other nonspecific skin eruption: Secondary | ICD-10-CM

## 2012-10-12 DIAGNOSIS — F17200 Nicotine dependence, unspecified, uncomplicated: Secondary | ICD-10-CM | POA: Insufficient documentation

## 2012-10-12 DIAGNOSIS — F411 Generalized anxiety disorder: Secondary | ICD-10-CM | POA: Insufficient documentation

## 2012-10-12 DIAGNOSIS — Z79899 Other long term (current) drug therapy: Secondary | ICD-10-CM | POA: Insufficient documentation

## 2012-10-12 MED ORDER — HYDROXYZINE HCL 25 MG PO TABS: 25.0000 mg | ORAL_TABLET | Freq: Four times a day (QID) | ORAL | Status: DC

## 2012-10-12 NOTE — ED Provider Notes (Signed)
History     CSN: [id number]  Arrival date & time 10/12/12  1133   First MD Initiated Contact with Patient 10/12/12 1150      No chief complaint on file.   (Consider location/radiation/quality/duration/timing/severity/associated sxs/prior treatment) HPI .  Residual female presents to the emergency department with chief complaint of rash.  The rash is pruritic.  He rested comfortably 6 months.  The patient has been seen by her primary care physician who gave her a steroid and Benadryl.  She states that she had no relief with either of these medications, she is awakened at night with intense itching.  She has no contacts with similar symptoms, no changes in lotions, soaps, detergents.  No known exposure to plant contact. Denies fevers, chills, myalgias, arthralgias.    Past Medical History  Diagnosis Date  . Anxiety   . Ectopic pregnancy     Past Surgical History  Procedure Laterality Date  . Cesarean section      No family history on file.  History  Substance Use Topics  . Smoking status: Current Every Day Smoker  . Smokeless tobacco: Not on file  . Alcohol Use: No    OB History   Grav Para Term Preterm Abortions TAB SAB Ect Mult Living                  Review of Systems  Constitutional: Negative for fever and chills.  HENT: Negative for trouble swallowing.   Respiratory: Negative for shortness of breath.   Cardiovascular: Negative for chest pain.  Gastrointestinal: Negative for nausea, vomiting, abdominal pain, diarrhea and constipation.  Genitourinary: Negative for dysuria and hematuria.  Musculoskeletal: Negative for myalgias and arthralgias.  Skin: Positive for rash.  Neurological: Negative for numbness.  All other systems reviewed and are negative.    Allergies  Review of patient's allergies indicates no known allergies.  Home Medications   Current Outpatient Rx  Name  Route  Sig  Dispense  Refill  . ALPRAZolam (XANAX) 0.5 MG tablet   Oral   Take  0.5 mg by mouth 3 (three) times daily.           Marland Kitchen HYDROcodone-acetaminophen (NORCO) 7.5-325 MG per tablet   Oral   Take 1 tablet by mouth every 6 (six) hours as needed. For pain           BP 104/71  Pulse 79  Temp(Src) 97.2 F (36.2 C)  Resp 16  SpO2 98%  Physical Exam  Constitutional: She is oriented to person, place, and time. She appears well-developed and well-nourished. No distress.  HENT:  Head: Normocephalic and atraumatic.  Eyes: Conjunctivae are normal. No scleral icterus.  Neck: Normal range of motion.  Cardiovascular: Normal rate, regular rhythm and normal heart sounds.  Exam reveals no gallop and no friction rub.   No murmur heard. Pulmonary/Chest: Effort normal and breath sounds normal. No respiratory distress.  Abdominal: Soft. Bowel sounds are normal. She exhibits no distension and no mass. There is no tenderness. There is no guarding.  Neurological: She is alert and oriented to person, place, and time.  Skin: Skin is warm and dry. Rash noted. She is not diaphoretic.  Several annular papules ranging form 1-1.5 cm in diameter with crusting and excoration on the hands, arms, trunk. No facial or genital inolvement, No interdigital involvement.     ED Course  Procedures (including critical care time)  Labs Reviewed - No data to display No results found.   1. Rash  MDM  Patient with a rash.  She has no fevers, no signs of systemic infection or cellulitis.  Patient states steroid did not help.  I will discharge the patient with Atarax.  She is to follow up with her primary care physician with referral to dermatology for further evaluation. The patient appears reasonably screened and/or stabilized for discharge and I doubt any other medical condition or other [facility] requiring further screening, evaluation, or treatment in the ED at this time prior to discharge.         Arthor Captain, PA-C 10/12/12 1306

## 2012-10-12 NOTE — ED Notes (Addendum)
Pt has patchy and itchy rash arms and torso.  Pt seen her in past and seen by PCP.  Has been given benadryl and prednisone but no relief. Pt states the patches will drain fluid when she scratches them.  Pt alert X4

## 2012-10-12 NOTE — ED Notes (Signed)
Pt staes that she has rash all over in patches has been seen by dr here and her dr given meds no better pt has 2 very small children w/ her

## 2012-10-14 NOTE — ED Provider Notes (Signed)
Medical screening examination/treatment/procedure(s) were performed by non-physician practitioner and as supervising physician I was immediately available for consultation/collaboration.  Annamaria Salah, MD 10/14/12 1648 

## 2013-03-30 ENCOUNTER — Encounter (HOSPITAL_COMMUNITY): Payer: Self-pay | Admitting: *Deleted

## 2013-03-30 ENCOUNTER — Emergency Department (HOSPITAL_COMMUNITY)
Admission: EM | Admit: 2013-03-30 | Discharge: 2013-03-31 | Disposition: A | Discharge status: Home / Self Care | Payer: Medicaid Other | Attending: Emergency Medicine | Admitting: Emergency Medicine

## 2013-03-30 ENCOUNTER — Emergency Department (HOSPITAL_COMMUNITY): Payer: Medicaid Other

## 2013-03-30 DIAGNOSIS — F17200 Nicotine dependence, unspecified, uncomplicated: Secondary | ICD-10-CM | POA: Insufficient documentation

## 2013-03-30 DIAGNOSIS — J029 Acute pharyngitis, unspecified: Secondary | ICD-10-CM | POA: Insufficient documentation

## 2013-03-30 DIAGNOSIS — R51 Headache: Secondary | ICD-10-CM | POA: Insufficient documentation

## 2013-03-30 DIAGNOSIS — J3489 Other specified disorders of nose and nasal sinuses: Secondary | ICD-10-CM | POA: Insufficient documentation

## 2013-03-30 DIAGNOSIS — F411 Generalized anxiety disorder: Secondary | ICD-10-CM | POA: Insufficient documentation

## 2013-03-30 DIAGNOSIS — IMO0001 Reserved for inherently not codable concepts without codable children: Secondary | ICD-10-CM | POA: Insufficient documentation

## 2013-03-30 DIAGNOSIS — J189 Pneumonia, unspecified organism: Secondary | ICD-10-CM

## 2013-03-30 DIAGNOSIS — H9209 Otalgia, unspecified ear: Secondary | ICD-10-CM | POA: Insufficient documentation

## 2013-03-30 DIAGNOSIS — Z79899 Other long term (current) drug therapy: Secondary | ICD-10-CM | POA: Insufficient documentation

## 2013-03-30 DIAGNOSIS — J159 Unspecified bacterial pneumonia: Secondary | ICD-10-CM | POA: Insufficient documentation

## 2013-03-30 LAB — BASIC METABOLIC PANEL
Calcium: 8.9 mg/dL (ref 8.4–10.5)
Creatinine, Ser: 0.61 mg/dL (ref 0.50–1.10)
GFR calc Af Amer: >90 mL/min (ref >90)
Sodium: 136 mEq/L (ref 135–145)

## 2013-03-30 LAB — CBC WITH DIFFERENTIAL/PLATELET
Basophils Absolute: 0 10*3/uL (ref 0.0–0.1)
Basophils Relative: 0 % (ref 0–1)
Eosinophils Relative: 0 % (ref 0–5)
HCT: 38.7 % (ref 36.0–46.0)
MCHC: 35.7 g/dL (ref 30.0–36.0)
MCV: 90.4 fL (ref 78.0–100.0)
Monocytes Absolute: 0.5 10*3/uL (ref 0.1–1.0)
Neutro Abs: 7 10*3/uL (ref 1.7–7.7)
Platelets: 266 10*3/uL (ref 150–400)
RDW: 13.7 % (ref 11.5–15.5)

## 2013-03-30 MED ORDER — DOXYCYCLINE HYCLATE 100 MG PO TABS
100.0000 mg | ORAL_TABLET | Freq: Once | ORAL | Status: AC
  Administered 2013-03-30: 100 mg via ORAL
  Filled 2013-03-30: qty 1

## 2013-03-30 MED ORDER — KETOROLAC TROMETHAMINE 30 MG/ML IJ SOLN
30.0000 mg | Freq: Once | INTRAMUSCULAR | Status: AC
  Administered 2013-03-30: 30 mg via INTRAVENOUS
  Filled 2013-03-30: qty 1

## 2013-03-30 MED ORDER — DOXYCYCLINE HYCLATE 100 MG PO CAPS: 100.0000 mg | ORAL_CAPSULE | Freq: Two times a day (BID) | ORAL | Status: DC

## 2013-03-30 MED ORDER — SODIUM CHLORIDE 0.9 % IV SOLN
INTRAVENOUS | Status: DC
  Administered 2013-03-30: 22:00:00 via INTRAVENOUS

## 2013-03-30 NOTE — ED Provider Notes (Signed)
CSN: [id number]     Arrival date & time 03/30/13  1745 History  This chart was scribed for non-physician practitioner, Felicie Morn, NP, working with Dagmar Hait, MD by Shari Heritage, ED Scribe. This patient was seen in room TR10C/TR10C and the patient's care was started at 8:39 PM.   Chief Complaint  Patient presents with  . Cough    Patient is a 37 y.o. female presenting with cough. The history is provided by the patient. No language interpreter was used.  Cough Severity:  Moderate Duration:  10 days Ineffective treatments: pain medicines. Associated symptoms: chills, ear pain, fever, headaches, myalgias, rhinorrhea, sinus congestion and sore throat     HPI Comments: Natalie Barrera is a 37 y.o. female who presents to the Emergency Department complaining of moderate cough and subjective fever onset 10 days ago. She reports associated otalgia, headache, right upper chest wall pain, chills, rhinorrhea, congestion, sore throat, and myalgias. She has taking pain medicines at home with no relief. She has a medical history of anxiety. She is a current every day smoker.    Past Medical History  Diagnosis Date  . Anxiety   . Ectopic pregnancy    Past Surgical History  Procedure Laterality Date  . Cesarean section     History reviewed. No pertinent family history. History  Substance Use Topics  . Smoking status: Current Every Day Smoker  . Smokeless tobacco: Not on file  . Alcohol Use: No   OB History   Grav Para Term Preterm Abortions TAB SAB Ect Mult Living                 Review of Systems  Constitutional: Positive for fever and chills.  HENT: Positive for ear pain, congestion, sore throat and rhinorrhea.   Respiratory: Positive for cough.   Musculoskeletal: Positive for myalgias.       Positive for right upper chest wall pain.  Neurological: Positive for headaches.  All other systems reviewed and are negative.    Allergies  Keflex  Home Medications    Current Outpatient Rx  Name  Route  Sig  Dispense  Refill  . ALPRAZolam (XANAX) 0.5 MG tablet   Oral   Take 0.5 mg by mouth 3 (three) times daily.           Marland Kitchen HYDROcodone-acetaminophen (NORCO) 7.5-325 MG per tablet   Oral   Take 1 tablet by mouth every 6 (six) hours as needed. For pain         . hydrOXYzine (ATARAX/VISTARIL) 25 MG tablet   Oral   Take 1 tablet (25 mg total) by mouth every 6 (six) hours.   12 tablet   0    Triage Vitals: BP 104/72  Pulse 90  Temp(Src) 98.6 F (37 C) (Oral)  Resp 22  SpO2 100% Physical Exam  Nursing note and vitals reviewed. Constitutional: She is oriented to person, place, and time. She appears well-developed and well-nourished. No distress.  HENT:  Head: Normocephalic and atraumatic.  Right Ear: Tympanic membrane, external ear and ear canal normal.  Left Ear: Tympanic membrane, external ear and ear canal normal.  Mouth/Throat: Posterior oropharyngeal erythema present. No oropharyngeal exudate.  Eyes: EOM are normal.  Neck: Neck supple. No tracheal deviation present.  Cardiovascular: Normal rate, regular rhythm and normal heart sounds.   Pulmonary/Chest: Effort normal. No respiratory distress. She has rhonchi (distal, bilaterally).  Musculoskeletal: Normal range of motion.  Neurological: She is alert and oriented to person, place, and  time.  Skin: Skin is warm and dry.  Psychiatric: She has a normal mood and affect. Her behavior is normal.    ED Course  Procedures (including critical care time) DIAGNOSTIC STUDIES: Oxygen Saturation is 100% on room air, normal by my interpretation.    COORDINATION OF CARE: 8:44 PM- Patient informed of current plan for treatment and evaluation and agrees with plan at this time.     Labs Review Labs Reviewed  CBC WITH DIFFERENTIAL  BASIC METABOLIC PANEL  URINALYSIS, ROUTINE W REFLEX MICROSCOPIC    Imaging Review Dg Chest 2 View  03/30/2013   CLINICAL DATA:  Fever, cough.  EXAM: CHEST  2  VIEW  COMPARISON:  07/24/2012  FINDINGS: Consolidation in the lower lobes bilaterally concerning for pneumonia, right slightly greater than left. Heart is upper limits normal in size. No effusions. Mild hyperinflation of the lungs. No acute bony abnormality.  IMPRESSION: Bilateral lower lobe airspace opacities, right slightly greater than left concerning for pneumonia.   Electronically Signed   By: Charlett Nose M.D.   On: 03/30/2013 23:13   Radiology results reviewed and shared with patient.  Patient feels better after IV fluids and medication.  Discussed with Dr. Gwendolyn Grant.  Will treat pneumonia with doxycycline.  Patient to follow-up with PCP next week.  MDM  Pneumonia  I personally performed the services described in this documentation, which was scribed in my presence. The recorded information has been reviewed and is accurate.     Jimmye Norman, NP 03/31/13 [phone]

## 2013-03-30 NOTE — ED Notes (Signed)
Pt came in with c/o chills, cough, right eye swelling and runny nose x 10 days.  Mother noticed yellow mucus.  NAD.  Pt has taken pain medication at home with no relief.  NAD.

## 2013-03-31 NOTE — ED Provider Notes (Signed)
Medical screening examination/treatment/procedure(s) were performed by non-physician practitioner and as supervising physician I was immediately available for consultation/collaboration.   William Jovontae Banko, MD 03/31/13 0045 

## 2013-05-26 ENCOUNTER — Emergency Department (HOSPITAL_COMMUNITY): Payer: Medicaid Other

## 2013-05-26 ENCOUNTER — Encounter (HOSPITAL_COMMUNITY): Payer: Self-pay | Admitting: Emergency Medicine

## 2013-05-26 ENCOUNTER — Emergency Department (HOSPITAL_COMMUNITY)
Admission: EM | Admit: 2013-05-26 | Discharge: 2013-05-26 | Disposition: A | Discharge status: Home / Self Care | Payer: Medicaid Other | Attending: Emergency Medicine | Admitting: Emergency Medicine

## 2013-05-26 DIAGNOSIS — R319 Hematuria, unspecified: Secondary | ICD-10-CM | POA: Insufficient documentation

## 2013-05-26 DIAGNOSIS — M545 Low back pain, unspecified: Secondary | ICD-10-CM | POA: Insufficient documentation

## 2013-05-26 DIAGNOSIS — Z8742 Personal history of other diseases of the female genital tract: Secondary | ICD-10-CM | POA: Insufficient documentation

## 2013-05-26 DIAGNOSIS — F411 Generalized anxiety disorder: Secondary | ICD-10-CM | POA: Insufficient documentation

## 2013-05-26 DIAGNOSIS — Z79899 Other long term (current) drug therapy: Secondary | ICD-10-CM | POA: Insufficient documentation

## 2013-05-26 DIAGNOSIS — R1031 Right lower quadrant pain: Secondary | ICD-10-CM | POA: Insufficient documentation

## 2013-05-26 DIAGNOSIS — R350 Frequency of micturition: Secondary | ICD-10-CM | POA: Insufficient documentation

## 2013-05-26 DIAGNOSIS — R110 Nausea: Secondary | ICD-10-CM | POA: Insufficient documentation

## 2013-05-26 DIAGNOSIS — R300 Dysuria: Secondary | ICD-10-CM | POA: Insufficient documentation

## 2013-05-26 DIAGNOSIS — F17200 Nicotine dependence, unspecified, uncomplicated: Secondary | ICD-10-CM | POA: Insufficient documentation

## 2013-05-26 DIAGNOSIS — Z87440 Personal history of urinary (tract) infections: Secondary | ICD-10-CM | POA: Insufficient documentation

## 2013-05-26 DIAGNOSIS — Z3202 Encounter for pregnancy test, result negative: Secondary | ICD-10-CM | POA: Insufficient documentation

## 2013-05-26 HISTORY — DX: Urinary tract infection, site not specified: N39.0

## 2013-05-26 LAB — URINALYSIS, ROUTINE W REFLEX MICROSCOPIC
Glucose, UA: NEGATIVE mg/dL
Hgb urine dipstick: NEGATIVE
Specific Gravity, Urine: 1.015 (ref 1.005–1.030)
pH: 7 (ref 5.0–8.0)

## 2013-05-26 LAB — CBC WITH DIFFERENTIAL/PLATELET
Basophils Absolute: 0 10*3/uL (ref 0.0–0.1)
Basophils Relative: 0 % (ref 0–1)
HCT: 40.5 % (ref 36.0–46.0)
MCHC: 35.1 g/dL (ref 30.0–36.0)
Monocytes Absolute: 0.7 10*3/uL (ref 0.1–1.0)
Neutro Abs: 10.3 10*3/uL — ABNORMAL HIGH (ref 1.7–7.7)
RDW: 15 % (ref 11.5–15.5)

## 2013-05-26 LAB — COMPREHENSIVE METABOLIC PANEL
AST: 12 U/L (ref 0–37)
Albumin: 3.6 g/dL (ref 3.5–5.2)
Calcium: 8.8 mg/dL (ref 8.4–10.5)
Chloride: 103 mEq/L (ref 96–112)
Creatinine, Ser: 0.74 mg/dL (ref 0.50–1.10)
Total Bilirubin: 0.2 mg/dL — ABNORMAL LOW (ref 0.3–1.2)
Total Protein: 7 g/dL (ref 6.0–8.3)

## 2013-05-26 LAB — URINE MICROSCOPIC-ADD ON

## 2013-05-26 LAB — WET PREP, GENITAL
Clue Cells Wet Prep HPF POC: NONE SEEN
Yeast Wet Prep HPF POC: NONE SEEN

## 2013-05-26 MED ORDER — OXYCODONE-ACETAMINOPHEN 5-325 MG PO TABS: 2.0000 | ORAL_TABLET | ORAL | Status: DC | PRN

## 2013-05-26 MED ORDER — HYDROMORPHONE HCL PF 1 MG/ML IJ SOLN
1.0000 mg | Freq: Once | INTRAMUSCULAR | Status: AC
  Administered 2013-05-26: 1 mg via INTRAVENOUS
  Filled 2013-05-26: qty 1

## 2013-05-26 MED ORDER — IOHEXOL 300 MG/ML  SOLN
80.0000 mL | Freq: Once | INTRAMUSCULAR | Status: AC | PRN
  Administered 2013-05-26: 80 mL via INTRAVENOUS

## 2013-05-26 MED ORDER — SODIUM CHLORIDE 0.9 % IV BOLUS (SEPSIS)
1000.0000 mL | Freq: Once | INTRAVENOUS | Status: AC
  Administered 2013-05-26: 1000 mL via INTRAVENOUS

## 2013-05-26 MED ORDER — IOHEXOL 300 MG/ML  SOLN
50.0000 mL | Freq: Once | INTRAMUSCULAR | Status: AC | PRN
  Administered 2013-05-26: 50 mL via ORAL

## 2013-05-26 MED ORDER — ONDANSETRON HCL 4 MG/2ML IJ SOLN
4.0000 mg | Freq: Once | INTRAMUSCULAR | Status: AC
  Administered 2013-05-26: 4 mg via INTRAVENOUS
  Filled 2013-05-26: qty 2

## 2013-05-26 NOTE — ED Notes (Addendum)
C/o right flank pain radiating down RLE x 2 weeks, states past 3 days radiating into RLQ. +nausea, no emesis, denies injury, diarrhea or discharge. C/o urinary frequency & dysuria x 1 week. States pain worse with mvmt.  Saw PCP Mon for same & had an x ray done but does not know results.  Pt drove self here & has her 37 yr old daughter with her. Attempting to find someone to look after her child

## 2013-05-26 NOTE — ED Provider Notes (Signed)
Medical screening examination/treatment/procedure(s) were performed by non-physician practitioner and as supervising physician I was immediately available for consultation/collaboration.  Joanthan Hlavacek L Altair Stanko, MD 05/26/13 1629 

## 2013-05-26 NOTE — ED Provider Notes (Signed)
CSN: [id number]     Arrival date & time 05/26/13  4098 History  This chart was scribed for non-physician practitioner Irish Elders, FNP,  working with Flint Melter, MD, by Yevette Edwards, ED Scribe. This patient was seen in room B14C/B14C and the patient's care was started at 9:43 AM.   First MD Initiated Contact with Patient 05/26/13 [phone]     No chief complaint on file.  The history is provided by the patient. No language interpreter was used.   HPI Comments: Natalie Barrera is a 37 y.o. female, with a h/o chronic urinary tract infections and ovarian cysts, who presents to the Emergency Department complaining of constant lower back pain which radiates to her RLQ and intermittently "shoots" down her leg and which has gradually-increased over the past two weeks. The pt rates the pain as 10/10. As associated symptoms, the pt has also experienced nausea, dysuria accompanied with burning and itching, hematuria noticed upon wiping, and frequency. She denies emesis, diarrhea, or a fever. She was treated at the ED five days ago and received x-rays of her lower back; she does not know the result of the x-rays. The pt's last menstruation was a month ago; she states that her menstruation lasts 7-14 days. She also states that at baseline her menstruations are irregular, and she is using birth control, Lomedia, to address the irregularity. She is a current smoker.   Past Medical History  Diagnosis Date  . Anxiety   . Ectopic pregnancy   . Chronic UTI (urinary tract infection)    Past Surgical History  Procedure Laterality Date  . Cesarean section     No family history on file. History  Substance Use Topics  . Smoking status: Current Every Day Smoker -- 1.00 packs/day    Types: Cigarettes  . Smokeless tobacco: Not on file  . Alcohol Use: No   No OB history provided.  Review of Systems  Constitutional: Negative for fever.  Gastrointestinal: Positive for nausea and abdominal pain. Negative for  vomiting and diarrhea.  Genitourinary: Positive for dysuria, frequency and hematuria.  Musculoskeletal: Positive for back pain.  All other systems reviewed and are negative.    Allergies  Keflex  Home Medications   Current Outpatient Rx  Name  Route  Sig  Dispense  Refill  . ALPRAZolam (XANAX) 0.5 MG tablet   Oral   Take 0.5 mg by mouth 3 (three) times daily.           Marland Kitchen doxycycline (VIBRAMYCIN) 100 MG capsule   Oral   Take 1 capsule (100 mg total) by mouth 2 (two) times daily.   19 capsule   0   . HYDROcodone-acetaminophen (NORCO) 7.5-325 MG per tablet   Oral   Take 1 tablet by mouth every 6 (six) hours as needed. For pain         . hydrOXYzine (ATARAX/VISTARIL) 25 MG tablet   Oral   Take 1 tablet (25 mg total) by mouth every 6 (six) hours.   12 tablet   0    Triage Vitals: BP 102/68  Pulse 97  Temp(Src) 97.5 F (36.4 C) (Oral)  Resp 16  Ht 5\' 3"  (1.6 m)  Wt 105 lb (47.628 kg)  BMI 18.60 kg/m2  SpO2 97%  LMP 04/24/2013  Physical Exam  Nursing note and vitals reviewed. Constitutional: She is oriented to person, place, and time. She appears well-developed and well-nourished. No distress.  HENT:  Head: Normocephalic and atraumatic.  Eyes: EOM are  normal.  Neck: Neck supple. No tracheal deviation present.  Cardiovascular: Normal rate, regular rhythm, normal heart sounds and intact distal pulses.   No murmur heard. Pulmonary/Chest: Effort normal and breath sounds normal. No respiratory distress. She has no wheezes. She has no rales.  Abdominal: Soft. Bowel sounds are normal. There is tenderness.  RLQ tenderness.   Genitourinary: Uterus is not deviated and not enlarged. Cervix exhibits discharge. Cervix exhibits no motion tenderness and no friability. Right adnexum displays tenderness. Right adnexum displays no mass. Left adnexum displays no mass, no tenderness and no fullness.  Musculoskeletal: Normal range of motion. She exhibits tenderness.  Right CVA  tenderness.   Neurological: She is alert and oriented to person, place, and time.  Skin: Skin is warm and dry.  Psychiatric: She has a normal mood and affect. Her behavior is normal.    ED Course  Procedures (including critical care time)  DIAGNOSTIC STUDIES: Oxygen Saturation is 97% on room air, normal by my interpretation.    COORDINATION OF CARE:  9:50 AM- Discussed treatment plan with patient which includes lab work, an UA, and a pelvic exam, and the patient agreed to the plan.   11:24 AM- Performed pelvic exam.     Labs Review Labs Reviewed  CBC WITH DIFFERENTIAL - Abnormal; Notable for the following:    WBC 12.6 (*)    Neutrophils Relative % 82 (*)    Neutro Abs 10.3 (*)    All other components within normal limits  URINALYSIS, ROUTINE W REFLEX MICROSCOPIC - Abnormal; Notable for the following:    APPearance CLOUDY (*)    Leukocytes, UA SMALL (*)    All other components within normal limits  URINE MICROSCOPIC-ADD ON - Abnormal; Notable for the following:    Bacteria, UA FEW (*)    All other components within normal limits  URINE CULTURE  COMPREHENSIVE METABOLIC PANEL  POCT PREGNANCY, URINE   Imaging Review No results found.  EKG Interpretation   None       MDM   1. Lower abdominal pain, right    Right lower quadrant pain and tenderness for several days. Afebrile, non-toxic appearing. Urine, negative. Pelvic exam consistent with right lower quadrant tenderness. No CMT, normal cervix. Denies no new sex partners. No problems urinating and having bowel movements in her normal pattern. WBC 12.6. Abd/pelvis CT significant for ovarian cysts right and left ovaries. No free fluid in abdomen, appendix not clearly identified. Pt instructed to return if pain worsens or if vomiting or fever. Follow-up with PCP early next week. Percocet for pain, given prescription for home and instructed not to drive while taking.   I personally performed the services described in this  documentation, which was scribed in my presence. The recorded information has been reviewed and is accurate.    Irish Elders, NP 05/26/13 1524

## 2013-05-26 NOTE — ED Notes (Signed)
Pt states R lower back pain that radiates to RLQ and shoots down R leg.  Had X-ray at pcp, but has not heard results yet.  C/o burning with urination and hematuria.  Denies vaginal discharge.

## 2013-05-28 LAB — URINE CULTURE
Colony Count: >=100000
Special Requests: NORMAL

## 2013-05-29 ENCOUNTER — Telehealth (HOSPITAL_COMMUNITY): Payer: Self-pay | Admitting: *Deleted

## 2013-05-29 NOTE — ED Notes (Signed)
called and informed pt of UTI, pt gave pharmacy [facility] at [phone].   Bactrim DS 1 tab BID x 3 days, was called into [facility].

## 2013-10-27 ENCOUNTER — Emergency Department (HOSPITAL_COMMUNITY)
Admission: EM | Admit: 2013-10-27 | Discharge: 2013-10-27 | Disposition: A | Discharge status: Home / Self Care | Payer: Medicaid Other | Attending: Emergency Medicine | Admitting: Emergency Medicine

## 2013-10-27 ENCOUNTER — Encounter (HOSPITAL_COMMUNITY): Payer: Self-pay | Admitting: Emergency Medicine

## 2013-10-27 ENCOUNTER — Emergency Department (HOSPITAL_COMMUNITY): Payer: Medicaid Other

## 2013-10-27 DIAGNOSIS — Z87440 Personal history of urinary (tract) infections: Secondary | ICD-10-CM | POA: Insufficient documentation

## 2013-10-27 DIAGNOSIS — S99929A Unspecified injury of unspecified foot, initial encounter: Principal | ICD-10-CM

## 2013-10-27 DIAGNOSIS — Y929 Unspecified place or not applicable: Secondary | ICD-10-CM | POA: Insufficient documentation

## 2013-10-27 DIAGNOSIS — Z79899 Other long term (current) drug therapy: Secondary | ICD-10-CM | POA: Insufficient documentation

## 2013-10-27 DIAGNOSIS — Y9389 Activity, other specified: Secondary | ICD-10-CM | POA: Insufficient documentation

## 2013-10-27 DIAGNOSIS — S99919A Unspecified injury of unspecified ankle, initial encounter: Principal | ICD-10-CM

## 2013-10-27 DIAGNOSIS — S8990XA Unspecified injury of unspecified lower leg, initial encounter: Secondary | ICD-10-CM | POA: Insufficient documentation

## 2013-10-27 DIAGNOSIS — X500XXA Overexertion from strenuous movement or load, initial encounter: Secondary | ICD-10-CM | POA: Insufficient documentation

## 2013-10-27 DIAGNOSIS — F411 Generalized anxiety disorder: Secondary | ICD-10-CM | POA: Insufficient documentation

## 2013-10-27 DIAGNOSIS — M25572 Pain in left ankle and joints of left foot: Secondary | ICD-10-CM

## 2013-10-27 DIAGNOSIS — F17200 Nicotine dependence, unspecified, uncomplicated: Secondary | ICD-10-CM | POA: Insufficient documentation

## 2013-10-27 MED ORDER — OXYCODONE-ACETAMINOPHEN 5-325 MG PO TABS
1.0000 | ORAL_TABLET | Freq: Once | ORAL | Status: AC
  Administered 2013-10-27: 1 via ORAL
  Filled 2013-10-27: qty 1

## 2013-10-27 MED ORDER — IBUPROFEN 800 MG PO TABS: 800.0000 mg | ORAL_TABLET | Freq: Three times a day (TID) | ORAL | Status: DC | PRN

## 2013-10-27 MED ORDER — HYDROCODONE-ACETAMINOPHEN 5-325 MG PO TABS: 1.0000 | ORAL_TABLET | ORAL | Status: DC | PRN

## 2013-10-27 NOTE — ED Notes (Signed)
Ortho paged. 

## 2013-10-27 NOTE — ED Notes (Signed)
Pt c/o left foot pain onset last night. Pt reports that she was going down the steps and the last one was higher than she thought. When she stepped down her left foot twisted. Pt ambulatory with a limp.

## 2013-10-27 NOTE — Discharge Instructions (Signed)
Read the information below.  Use the prescribed medication as directed.  Please discuss all new medications with your pharmacist.  Do not take additional tylenol while taking the prescribed pain medication to avoid overdose.  You may return to the Emergency Department at any time for worsening condition or any new symptoms that concern you.  If you develop uncontrolled pain, weakness or numbness of the extremity, severe discoloration of the skin, or you are unable to move your ankle, return to the ER for a recheck.      Ankle Pain Ankle pain is a common symptom. The bones, cartilage, tendons, and muscles of the ankle joint perform a lot of work each day. The ankle joint holds your body weight and allows you to move around. Ankle pain can occur on either side or back of 1 or both ankles. Ankle pain may be sharp and burning or dull and aching. There may be tenderness, stiffness, redness, or warmth around the ankle. The pain occurs more often when a person walks or puts pressure on the ankle. CAUSES  There are many reasons ankle pain can develop. It is important to work with your caregiver to identify the cause since many conditions can impact the bones, cartilage, muscles, and tendons. Causes for ankle pain include:  Injury, including a break (fracture), sprain, or strain often due to a fall, sports, or a high-impact activity.  Swelling (inflammation) of a tendon (tendonitis).  Achilles tendon rupture.  Ankle instability after repeated sprains and strains.  Poor foot alignment.  Pressure on a nerve (tarsal tunnel syndrome).  Arthritis in the ankle or the lining of the ankle.  Crystal formation in the ankle (gout or pseudogout). DIAGNOSIS  A diagnosis is based on your medical history, your symptoms, results of your physical exam, and results of diagnostic tests. Diagnostic tests may include X-ray exams or a computerized magnetic scan (magnetic resonance imaging, MRI). TREATMENT  Treatment will  depend on the cause of your ankle pain and may include:  Keeping pressure off the ankle and limiting activities.  Using crutches or other walking support (a cane or brace).  Using rest, ice, compression, and elevation.  Participating in physical therapy or home exercises.  Wearing shoe inserts or special shoes.  Losing weight.  Taking medications to reduce pain or swelling or receiving an injection.  Undergoing surgery. HOME CARE INSTRUCTIONS   Only take over-the-counter or prescription medicines for pain, discomfort, or fever as directed by your caregiver.  Put ice on the injured area.  Put ice in a plastic bag.  Place a towel between your skin and the bag.  Leave the ice on for 15-20 minutes at a time, 03-04 times a day.  Keep your leg raised (elevated) when possible to lessen swelling.  Avoid activities that cause ankle pain.  Follow specific exercises as directed by your caregiver.  Record how often you have ankle pain, the location of the pain, and what it feels like. This information may be helpful to you and your caregiver.  Ask your caregiver about returning to work or sports and whether you should drive.  Follow up with your caregiver for further examination, therapy, or testing as directed. SEEK MEDICAL CARE IF:   Pain or swelling continues or worsens beyond 1 week.  You have an oral temperature above 102 F (38.9 C).  You are feeling unwell or have chills.  You are having an increasingly difficult time with walking.  You have loss of sensation or other new  symptoms.  You have questions or concerns. MAKE SURE YOU:   Understand these instructions.  Will watch your condition.  Will get help right away if you are not doing well or get worse. Document Released: 12/23/2009 Document Revised: 09/27/2011 Document Reviewed: 12/23/2009 [facility] Patient Information 2014 [place], [place].

## 2013-10-27 NOTE — ED Notes (Signed)
Waiting on Ortho to complete ASO Ankle.

## 2013-10-27 NOTE — ED Provider Notes (Signed)
CSN: [id number]     Arrival date & time 10/27/13  1016 History  This chart was scribed for non-physician practitioner, Trixie DredgeEmily Marcy Sookdeo, PA-C,working with Ward GivensIva L Knapp, MD, by Karle PlumberJennifer Tensley, ED Scribe.  This patient was seen in room TR05C/TR05C and the patient's care was started at 11:11 AM.  Chief Complaint  Patient presents with  . Foot Injury    Left foot   The history is provided by the patient. No language interpreter was used.   HPI Comments:  Natalie Barrera is a 38 y.o. female who presents to the Emergency Department complaining of a left foot injury that occurred approximately 16 hours ago. She states she went to step down some stairs and it was higher than she had anticipated and twisted her left foot. She reports associated sharp, shooting pain and moderate edema. Pt states the pain is 8/10. She reports taking Ibuprofen for pain. She denies numbness or weakness of the LLE.   Past Medical History  Diagnosis Date  . Anxiety   . Ectopic pregnancy   . Chronic UTI (urinary tract infection)    Past Surgical History  Procedure Laterality Date  . Cesarean section     No family history on file. History  Substance Use Topics  . Smoking status: Current Every Day Smoker -- 1.00 packs/day    Types: Cigarettes  . Smokeless tobacco: Not on file  . Alcohol Use: No   OB History   Grav Para Term Preterm Abortions TAB SAB Ect Mult Living                 Review of Systems  Musculoskeletal: Positive for arthralgias (left).  Skin: Negative for color change.  Neurological: Negative for weakness and numbness.  All other systems reviewed and are negative.   Allergies  Keflex  Home Medications   Current Outpatient Rx  Name  Route  Sig  Dispense  Refill  . ALPRAZolam (XANAX) 0.5 MG tablet   Oral   Take 0.5 mg by mouth 4 (four) times daily.          Marland Kitchen. FLUoxetine (PROZAC) 40 MG capsule   Oral   Take 40 mg by mouth daily.         Marland Kitchen. HYDROcodone-acetaminophen (NORCO) 7.5-325 MG  per tablet   Oral   Take 1 tablet by mouth 4 (four) times daily.         . Norethin Ace-Eth Estrad-FE (LOMEDIA 24 FE PO)   Oral   Take 1 tablet by mouth daily.         Marland Kitchen. oxyCODONE-acetaminophen (PERCOCET/ROXICET) 5-325 MG per tablet   Oral   Take 2 tablets by mouth every 4 (four) hours as needed for severe pain.   15 tablet   0    Triage Vitals: BP 103/70  Pulse 81  Temp(Src) 97.4 F (36.3 C) (Oral)  Resp 20  Ht 5\' 3"  (1.6 m)  Wt 111 lb (50.349 kg)  BMI 19.67 kg/m2  SpO2 100%  LMP 10/10/2013 Physical Exam  Nursing note and vitals reviewed. Constitutional: She appears well-developed and well-nourished. No distress.  HENT:  Head: Normocephalic and atraumatic.  Neck: Neck supple.  Cardiovascular:  Cap refill less than 2 seconds.   Pulmonary/Chest: Effort normal.  Musculoskeletal: She exhibits edema and tenderness.  Left ankle diffusely edematous and tender over lateral aspect of ankle and lateral aspect of foot. No discoloration. Left calf and left knee nontender, no erythema, no edema. Moves all digits.   Neurological: She  is alert.  Sensation intact.  Skin: She is not diaphoretic.    ED Course  Procedures (including critical care time) DIAGNOSTIC STUDIES: Oxygen Saturation is 100% on RA, normal by my interpretation.   COORDINATION OF CARE: 11:13 AM- Will X-Ray left foot and ankle. Will order Percocet for pt prior to discharge. Pt verbalizes understanding and agrees to plan.  Medications  oxyCODONE-acetaminophen (PERCOCET/ROXICET) 5-325 MG per tablet 1 tablet (1 tablet Oral Given 10/27/13 1116)    Labs Review Labs Reviewed - No data to display Imaging Review Dg Ankle Complete Left  10/27/2013   CLINICAL DATA:  Status post rolling injury of the left ankle now with bruising and swelling and pain  EXAM: LEFT ANKLE COMPLETE - 3+ VIEW  COMPARISON:  Left foot series of today's date  FINDINGS: The ankle joint mortise is preserved. The talar dome is intact. There is  no evidence of an acute talar or calcaneal fracture. There is a tiny plantar calcaneal spur. The metatarsal bases appear intact.  IMPRESSION: There is no acute bony abnormality of the left ankle.   Electronically Signed   By: David  [place]   On: 10/27/2013 11:14   Dg Foot Complete Left  10/27/2013   CLINICAL DATA:  Foot injury.  Pain.  EXAM: LEFT FOOT - COMPLETE 3+ VIEW  COMPARISON:  None.  FINDINGS: There is no evidence of fracture or dislocation. There is no evidence of arthropathy or other focal bone abnormality. Soft tissues are unremarkable.  IMPRESSION: Negative.   Electronically Signed   By: Amie Portland M.D.   On: 10/27/2013 11:13     EKG Interpretation None      MDM   Final diagnoses:  Left ankle pain    Pt with inversion injury to left ankle/foot last night.  Neurovascularly intact.  Ankle is stable.  Xrays negative.  Placed in ASO, crutches, d/c home with norco, ibuprofen.  PCP follow up.  Discussed result, findings, treatment, and follow up  with patient.  Pt given return precautions.  Pt verbalizes understanding and agrees with plan.      I personally performed the services described in this documentation, which was scribed in my presence. The recorded information has been reviewed and is accurate.    Trixie Dredge, PA-C 10/27/13 1205

## 2013-10-27 NOTE — ED Provider Notes (Signed)
Medical screening examination/treatment/procedure(s) were performed by non-physician practitioner and as supervising physician I was immediately available for consultation/collaboration.   EKG Interpretation None      Kharis Lapenna, MD, FACEP   Caria Transue L Gaither Biehn, MD 10/27/13 1556 

## 2013-10-27 NOTE — ED Notes (Signed)
Pt taken to Xray.

## 2013-10-27 NOTE — Progress Notes (Signed)
Orthopedic Tech Progress Note Patient Details:  [name] Mar 30, 1976 [id number]  Ortho Devices Type of Ortho Device: ASO Ortho Device/Splint Location: Left LE Ortho Device/Splint Interventions: Application   Asia R Thompson 10/27/2013, 12:10 PM

## 2014-06-12 ENCOUNTER — Encounter (HOSPITAL_COMMUNITY): Payer: Self-pay | Admitting: Emergency Medicine

## 2014-06-12 ENCOUNTER — Emergency Department (HOSPITAL_COMMUNITY)
Admission: EM | Admit: 2014-06-12 | Discharge: 2014-06-12 | Disposition: A | Discharge status: Home / Self Care | Payer: Medicaid Other | Attending: Emergency Medicine | Admitting: Emergency Medicine

## 2014-06-12 DIAGNOSIS — R6883 Chills (without fever): Secondary | ICD-10-CM | POA: Insufficient documentation

## 2014-06-12 DIAGNOSIS — N898 Other specified noninflammatory disorders of vagina: Secondary | ICD-10-CM | POA: Insufficient documentation

## 2014-06-12 DIAGNOSIS — R197 Diarrhea, unspecified: Secondary | ICD-10-CM | POA: Insufficient documentation

## 2014-06-12 DIAGNOSIS — Z720 Tobacco use: Secondary | ICD-10-CM | POA: Insufficient documentation

## 2014-06-12 DIAGNOSIS — Z9889 Other specified postprocedural states: Secondary | ICD-10-CM | POA: Insufficient documentation

## 2014-06-12 DIAGNOSIS — Z793 Long term (current) use of hormonal contraceptives: Secondary | ICD-10-CM | POA: Insufficient documentation

## 2014-06-12 DIAGNOSIS — R630 Anorexia: Secondary | ICD-10-CM | POA: Insufficient documentation

## 2014-06-12 DIAGNOSIS — Z87440 Personal history of urinary (tract) infections: Secondary | ICD-10-CM | POA: Insufficient documentation

## 2014-06-12 DIAGNOSIS — Z3202 Encounter for pregnancy test, result negative: Secondary | ICD-10-CM | POA: Insufficient documentation

## 2014-06-12 DIAGNOSIS — R61 Generalized hyperhidrosis: Secondary | ICD-10-CM | POA: Insufficient documentation

## 2014-06-12 DIAGNOSIS — R112 Nausea with vomiting, unspecified: Secondary | ICD-10-CM | POA: Insufficient documentation

## 2014-06-12 DIAGNOSIS — R079 Chest pain, unspecified: Secondary | ICD-10-CM | POA: Insufficient documentation

## 2014-06-12 DIAGNOSIS — R1030 Lower abdominal pain, unspecified: Secondary | ICD-10-CM

## 2014-06-12 DIAGNOSIS — Z9079 Acquired absence of other genital organ(s): Secondary | ICD-10-CM | POA: Insufficient documentation

## 2014-06-12 DIAGNOSIS — Z79899 Other long term (current) drug therapy: Secondary | ICD-10-CM | POA: Insufficient documentation

## 2014-06-12 DIAGNOSIS — R1084 Generalized abdominal pain: Secondary | ICD-10-CM | POA: Insufficient documentation

## 2014-06-12 DIAGNOSIS — F419 Anxiety disorder, unspecified: Secondary | ICD-10-CM | POA: Insufficient documentation

## 2014-06-12 LAB — URINALYSIS, ROUTINE W REFLEX MICROSCOPIC
BILIRUBIN URINE: NEGATIVE
GLUCOSE, UA: NEGATIVE mg/dL
Hgb urine dipstick: NEGATIVE
KETONES UR: NEGATIVE mg/dL
Leukocytes, UA: NEGATIVE
NITRITE: NEGATIVE
PH: 7.5 (ref 5.0–8.0)
PROTEIN: NEGATIVE mg/dL
Specific Gravity, Urine: 1.017 (ref 1.005–1.030)
Urobilinogen, UA: 1 mg/dL (ref 0.0–1.0)

## 2014-06-12 LAB — WET PREP, GENITAL
Clue Cells Wet Prep HPF POC: NONE SEEN
Trich, Wet Prep: NONE SEEN
WBC, Wet Prep HPF POC: NONE SEEN
YEAST WET PREP: NONE SEEN

## 2014-06-12 LAB — CBC WITH DIFFERENTIAL/PLATELET
BASOS ABS: 0 10*3/uL (ref 0.0–0.1)
Basophils Relative: 1 % (ref 0–1)
EOS ABS: 0.1 10*3/uL (ref 0.0–0.7)
Eosinophils Relative: 1 % (ref 0–5)
HEMATOCRIT: 39.9 % (ref 36.0–46.0)
Hemoglobin: 13 g/dL (ref 12.0–15.0)
LYMPHS PCT: 30 % (ref 12–46)
Lymphs Abs: 2.3 10*3/uL (ref 0.7–4.0)
MCH: 29.7 pg (ref 26.0–34.0)
MCHC: 32.6 g/dL (ref 30.0–36.0)
MCV: 91.1 fL (ref 78.0–100.0)
MONO ABS: 0.4 10*3/uL (ref 0.1–1.0)
Monocytes Relative: 5 % (ref 3–12)
Neutro Abs: 4.9 10*3/uL (ref 1.7–7.7)
Neutrophils Relative %: 63 % (ref 43–77)
Platelets: 272 10*3/uL (ref 150–400)
RBC: 4.38 MIL/uL (ref 3.87–5.11)
RDW: 13.9 % (ref 11.5–15.5)
WBC: 7.7 10*3/uL (ref 4.0–10.5)

## 2014-06-12 LAB — COMPREHENSIVE METABOLIC PANEL
ALK PHOS: 66 U/L (ref 39–117)
ALT: 8 U/L (ref 0–35)
AST: 13 U/L (ref 0–37)
Albumin: 4 g/dL (ref 3.5–5.2)
Anion gap: 14 (ref 5–15)
BILIRUBIN TOTAL: 0.3 mg/dL (ref 0.3–1.2)
BUN: 8 mg/dL (ref 6–23)
CHLORIDE: 102 meq/L (ref 96–112)
CO2: 20 meq/L (ref 19–32)
Calcium: 8.8 mg/dL (ref 8.4–10.5)
Creatinine, Ser: 0.52 mg/dL (ref 0.50–1.10)
GFR calc Af Amer: >90 mL/min (ref >90)
Glucose, Bld: 81 mg/dL (ref 70–99)
Potassium: 4.1 mEq/L (ref 3.7–5.3)
SODIUM: 136 meq/L — AB (ref 137–147)
Total Protein: 6.8 g/dL (ref 6.0–8.3)

## 2014-06-12 LAB — TROPONIN I: Troponin I: <0.3 ng/mL (ref <0.30)

## 2014-06-12 LAB — HIV ANTIBODY (ROUTINE TESTING W REFLEX): HIV 1&2 Ab, 4th Generation: NONREACTIVE

## 2014-06-12 LAB — PREGNANCY, URINE: Preg Test, Ur: NEGATIVE

## 2014-06-12 LAB — RPR

## 2014-06-12 LAB — LIPASE, BLOOD: Lipase: 20 U/L (ref 11–59)

## 2014-06-12 MED ORDER — MORPHINE SULFATE 4 MG/ML IJ SOLN
4.0000 mg | Freq: Once | INTRAMUSCULAR | Status: AC
  Administered 2014-06-12: 4 mg via INTRAVENOUS
  Filled 2014-06-12: qty 1

## 2014-06-12 MED ORDER — ONDANSETRON HCL 4 MG PO TABS: 4.0000 mg | ORAL_TABLET | Freq: Three times a day (TID) | ORAL | Status: DC | PRN

## 2014-06-12 MED ORDER — METOCLOPRAMIDE HCL 5 MG/ML IJ SOLN
10.0000 mg | Freq: Once | INTRAMUSCULAR | Status: AC
  Administered 2014-06-12: 10 mg via INTRAVENOUS
  Filled 2014-06-12: qty 2

## 2014-06-12 MED ORDER — HYDROCODONE-ACETAMINOPHEN 5-325 MG PO TABS: 1.0000 | ORAL_TABLET | Freq: Four times a day (QID) | ORAL | Status: DC | PRN

## 2014-06-12 NOTE — ED Notes (Signed)
Pt c/o upper abdominal pain that radiates down threw the middle of abdomen then around the lower back on both sides. Started this past Thursday. Also c/o nausea and vomiting. Stated that vomiting started Sunday and she has thrown up atleast 3 times since. Has been having some diarrhea but denies any today so far.

## 2014-06-12 NOTE — ED Provider Notes (Signed)
I saw and evaluated the patient, reviewed the resident's note and I agree with the findings and plan.  ECG MUSE not working: Normal sinus rhythm, ventricular rate 72, normal axis, RSR prime in lead V1 and V2, compared with prior ECG RSR prime now present  Several days of midline abdominal pain was upper and lower now both with more discomfort in the suprapubic region with mild epigastric tenderness mild to moderate suprapubic tenderness no tenderness to the right or left upper quadrants no tenderness to the right or left lower quadrants with no rebound.  Hurman HornJohn M Qusai Kem, MD 06/13/14 [id number]

## 2014-06-12 NOTE — Discharge Instructions (Signed)
-  The cause of your abdominal pain is unclear, but we do not think you need further treatment in the hospital. -Call your PCP to arrange follow-up in a couple of days.  Abdominal Pain Many things can cause abdominal pain. Usually, abdominal pain is not caused by a disease and will improve without treatment. It can often be observed and treated at home. Your health care provider will do a physical exam and possibly order blood tests and X-rays to help determine the seriousness of your pain. However, in many cases, more time must pass before a clear cause of the pain can be found. Before that point, your health care provider may not know if you need more testing or further treatment. HOME CARE INSTRUCTIONS  Monitor your abdominal pain for any changes. The following actions may help to alleviate any discomfort you are experiencing:  Only take over-the-counter or prescription medicines as directed by your health care provider.  Do not take laxatives unless directed to do so by your health care provider.  Try a clear liquid diet (broth, tea, or water) as directed by your health care provider. Slowly move to a bland diet as tolerated. SEEK MEDICAL CARE IF:  You have unexplained abdominal pain.  You have abdominal pain associated with nausea or diarrhea.  You have pain when you urinate or have a bowel movement.  You experience abdominal pain that wakes you in the night.  You have abdominal pain that is worsened or improved by eating food.  You have abdominal pain that is worsened with eating fatty foods.  You have a fever. SEEK IMMEDIATE MEDICAL CARE IF:   Your pain does not go away within 2 hours.  You keep throwing up (vomiting).  Your pain is felt only in portions of the abdomen, such as the right side or the left lower portion of the abdomen.  You pass bloody or black tarry stools. MAKE SURE YOU:  Understand these instructions.   Will watch your condition.   Will get help  right away if you are not doing well or get worse.  Document Released: 04/14/2005 Document Revised: 07/10/2013 Document Reviewed: 03/14/2013 [facility] Patient Information 2015 [place], [place]. This information is not intended to replace advice given to you by your health care provider. Make sure you discuss any questions you have with your health care provider.

## 2014-06-12 NOTE — ED Provider Notes (Signed)
CSN: [id number]     Arrival date & time 06/12/14  0957 History   First MD Initiated Contact with Patient 06/12/14 1003     Chief Complaint  Patient presents with  . Abdominal Pain   HPI Natalie Barrera is a 38 year old woman with history of ectopic pregnancy, ovarian cysts, chronic UTIs, and anxiety presenting with suprapubic abdominal pain extending above her umbilicus and intermittently radiating to her back.  She reports developing constant sharp pain last Thursday that has progressively been worsening.  On Sunday, she developed nausea and reports NBNB vomiting three times since then.  She also reports chest tightness for the last few days.  She says that her last menstrual period was on November 3rd and lasted for two days, which is shorter than normal for her. She also reports white vaginal discharge that is not foul-smelling.  She reports some mild diarrhea, but she denies dysuria, urinary changes, hematuria, hematochezia, or constipation.   Past Medical History  Diagnosis Date  . Anxiety   . Ectopic pregnancy   . Chronic UTI (urinary tract infection)    Past Surgical History  Procedure Laterality Date  . Cesarean section    . Salpingectomy      LEFT SIDE   No family history on file. History  Substance Use Topics  . Smoking status: Current Every Day Smoker -- 1.00 packs/day    Types: Cigarettes  . Smokeless tobacco: Not on file  . Alcohol Use: No   OB History    No data available     Review of Systems  Constitutional: Positive for chills, diaphoresis and appetite change. Negative for fever.  HENT: Negative for congestion, rhinorrhea and sore throat.   Respiratory: Positive for chest tightness. Negative for cough, shortness of breath and wheezing.   Cardiovascular: Negative for chest pain, palpitations and leg swelling.  Gastrointestinal: Positive for abdominal pain and diarrhea. Negative for nausea, vomiting, constipation and blood in stool.  Genitourinary: Positive  for vaginal discharge. Negative for dysuria, hematuria, vaginal bleeding, difficulty urinating and vaginal pain.  Musculoskeletal: Negative for myalgias and arthralgias.  Skin: Negative for rash.  Neurological: Negative for dizziness, weakness, numbness and headaches.      Allergies  Keflex  Home Medications   Prior to Admission medications   Medication Sig Start Date End Date Taking? Authorizing Provider  ALPRAZolam Prudy Feeler(XANAX) 1 MG tablet Take 1 mg by mouth 3 (three) times daily.    Historical Provider, MD  FLUoxetine (PROZAC) 40 MG capsule Take 40 mg by mouth daily.    Historical Provider, MD  HYDROcodone-acetaminophen (NORCO) 7.5-325 MG per tablet Take 1 tablet by mouth 4 (four) times daily.    Historical Provider, MD  HYDROcodone-acetaminophen (NORCO/VICODIN) 5-325 MG per tablet Take 1-2 tablets by mouth every 4 (four) hours as needed for moderate pain or severe pain. 10/27/13   Trixie DredgeEmily West, PA-C  HYDROcodone-acetaminophen (NORCO/VICODIN) 5-325 MG per tablet Take 1 tablet by mouth every 6 (six) hours as needed for moderate pain. 06/12/14   Luisa DagoEverett Eban Weick, MD  ibuprofen (ADVIL,MOTRIN) 200 MG tablet Take 800 mg by mouth once.    Historical Provider, MD  ibuprofen (ADVIL,MOTRIN) 800 MG tablet Take 1 tablet (800 mg total) by mouth every 8 (eight) hours as needed for mild pain or moderate pain. 10/27/13   Trixie DredgeEmily West, PA-C  Menthol, Topical Analgesic, (ICY HOT EX) Apply 1 application topically once. Apply to foot for pain    Historical Provider, MD  Norethin Ace-Eth Estrad-FE (LOMEDIA 24  FE PO) Take 1 tablet by mouth daily.    Historical Provider, MD  ondansetron (ZOFRAN) 4 MG tablet Take 1 tablet (4 mg total) by mouth every 8 (eight) hours as needed for nausea or vomiting. 06/12/14   Luisa DagoEverett Dayson Aboud, MD   BP 128/63 mmHg  Pulse 65  Temp(Src) 97.8 F (36.6 C) (Oral)  Resp 18  SpO2 100%  LMP 05/19/2014 Physical Exam  Constitutional: She is oriented to person, place, and time. She appears  well-developed and well-nourished. She appears distressed (Crying due to pain.).  HENT:  Head: Normocephalic and atraumatic.  Eyes: Conjunctivae and EOM are normal. Pupils are equal, round, and reactive to light. No scleral icterus.  Neck: Normal range of motion. Neck supple.  Cardiovascular: Normal rate, regular rhythm and normal heart sounds.   Pulmonary/Chest: Effort normal and breath sounds normal. No respiratory distress.  Abdominal: Bowel sounds are normal. She exhibits no distension. There is tenderness (Diffuse, centered in suprapubic area.). There is guarding. There is no rebound.  Musculoskeletal: Normal range of motion. She exhibits no edema or tenderness.  Neurological: She is alert and oriented to person, place, and time. No cranial nerve deficit. She exhibits normal muscle tone.  Skin: Skin is warm and dry. No rash noted. No erythema.    ED Course  Pelvic exam Date/Time: 06/12/2014 11:36 AM Performed by: Luisa DagoMODING, Catlynn Grondahl Authorized by: Hurman HornBEDNAR, JOHN M Consent: Verbal consent obtained. Consent given by: patient Patient understanding: patient states understanding of the procedure being performed Patient identity confirmed: verbally with patient Comments: Pelvic exam: normal external genitalia, vulva, vagina, cervix, uterus and adnexa, VULVA: normal appearing vulva with no masses, tenderness or lesions, VAGINA: vagina with normal color, no lesions, vaginal discharge - white, CERVIX: normal appearing cervix without discharge or lesions, UTERUS: uterus is normal size, shape, consistency and nontender, ADNEXA: normal adnexa in size, no masses, mild abdominal tenderness with motion of cervix, exam chaperoned by Ellin GoodieMegan Williams, RN.   (including critical care time) Labs Review Labs Reviewed  COMPREHENSIVE METABOLIC PANEL - Abnormal; Notable for the following:    Sodium 136 (*)    All other components within normal limits  WET PREP, GENITAL  GC/CHLAMYDIA PROBE AMP  LIPASE, BLOOD   CBC WITH DIFFERENTIAL  URINALYSIS, ROUTINE W REFLEX MICROSCOPIC  PREGNANCY, URINE  TROPONIN I  HIV ANTIBODY (ROUTINE TESTING)  RPR    Imaging Review No results found.   EKG Interpretation None      MDM   Final diagnoses:  Lower abdominal pain   Most likely pelvic in etiology, but will rule out GI and cardiac etiology given N/V and chest tightness.  Will perform pelvic exam, check pregnancy test, urine, labs while treating pain and nausea.  11:43 am: Pelvic exam unremarkable.  Mild abdominal tenderness with motion of cervix.  Not pregnant, U/A normal, trop/EKG negative, no signs of infection.  Awaiting wet prep, HIV, RPR, GC/chlam, and lipase.  1:10 pm: Wet prep and lipase negative, cause of abdominal pain unclear.  Patient currently resting comfortably and reports feeling much better.  Will discharge home and have her call PCP to arrange follow up in a couple of days.  Luisa DagoEverett Timothee Gali, MD 06/12/14 1311

## 2014-06-13 LAB — GC/CHLAMYDIA PROBE AMP
CT PROBE, AMP APTIMA: NEGATIVE
GC PROBE AMP APTIMA: NEGATIVE

## 2014-08-20 ENCOUNTER — Emergency Department (HOSPITAL_COMMUNITY)
Admission: EM | Admit: 2014-08-20 | Discharge: 2014-08-20 | Disposition: A | Discharge status: Home / Self Care | Payer: 59 | Attending: Emergency Medicine | Admitting: Emergency Medicine

## 2014-08-20 ENCOUNTER — Emergency Department (HOSPITAL_COMMUNITY): Payer: 59

## 2014-08-20 ENCOUNTER — Encounter (HOSPITAL_COMMUNITY): Payer: Self-pay | Admitting: Emergency Medicine

## 2014-08-20 DIAGNOSIS — Z720 Tobacco use: Secondary | ICD-10-CM | POA: Diagnosis not present

## 2014-08-20 DIAGNOSIS — L089 Local infection of the skin and subcutaneous tissue, unspecified: Secondary | ICD-10-CM | POA: Insufficient documentation

## 2014-08-20 DIAGNOSIS — Z79899 Other long term (current) drug therapy: Secondary | ICD-10-CM | POA: Diagnosis not present

## 2014-08-20 DIAGNOSIS — Y998 Other external cause status: Secondary | ICD-10-CM | POA: Diagnosis not present

## 2014-08-20 DIAGNOSIS — Z793 Long term (current) use of hormonal contraceptives: Secondary | ICD-10-CM | POA: Diagnosis not present

## 2014-08-20 DIAGNOSIS — Y9289 Other specified places as the place of occurrence of the external cause: Secondary | ICD-10-CM | POA: Diagnosis not present

## 2014-08-20 DIAGNOSIS — Z87440 Personal history of urinary (tract) infections: Secondary | ICD-10-CM | POA: Insufficient documentation

## 2014-08-20 DIAGNOSIS — Y9389 Activity, other specified: Secondary | ICD-10-CM | POA: Insufficient documentation

## 2014-08-20 DIAGNOSIS — W231XXA Caught, crushed, jammed, or pinched between stationary objects, initial encounter: Secondary | ICD-10-CM | POA: Insufficient documentation

## 2014-08-20 DIAGNOSIS — F419 Anxiety disorder, unspecified: Secondary | ICD-10-CM | POA: Diagnosis not present

## 2014-08-20 DIAGNOSIS — S6991XA Unspecified injury of right wrist, hand and finger(s), initial encounter: Secondary | ICD-10-CM | POA: Diagnosis present

## 2014-08-20 DIAGNOSIS — T1490XA Injury, unspecified, initial encounter: Secondary | ICD-10-CM

## 2014-08-20 MED ORDER — IBUPROFEN 400 MG PO TABS
800.0000 mg | ORAL_TABLET | Freq: Once | ORAL | Status: AC
  Administered 2014-08-20: 800 mg via ORAL
  Filled 2014-08-20: qty 2

## 2014-08-20 MED ORDER — HYDROCODONE-ACETAMINOPHEN 5-325 MG PO TABS: 1.0000 | ORAL_TABLET | ORAL | Status: DC | PRN

## 2014-08-20 MED ORDER — SULFAMETHOXAZOLE-TRIMETHOPRIM 800-160 MG PO TABS: 1.0000 | ORAL_TABLET | Freq: Two times a day (BID) | ORAL | Status: DC
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
## 2014-08-20 NOTE — Discharge Instructions (Signed)
Take Bactrim as directed until gone. Take Vicodin as needed for pain. Refer to attached documents for more information.

## 2014-08-20 NOTE — ED Provider Notes (Signed)
CSN: [id number]     Arrival date & time 08/20/14  0730 History   First MD Initiated Contact with Patient 08/20/14 [phone]     Chief Complaint  Patient presents with  . Hand Injury     (Consider location/radiation/quality/duration/timing/severity/associated sxs/prior Treatment) HPI Comments: Patient is a 39 year old female who presents with a 1 week history of right fifth finger pain that started after she slammed her finger in a door accidentally. The pain is aching and severe without radiation. Movement and palpation make the pain worse. Patient reports associated wound that occurred during the accident. Patient tried OTC medication for pain without relief. Patient reports associated swelling. No other injury.    Past Medical History  Diagnosis Date  . Anxiety   . Ectopic pregnancy   . Chronic UTI (urinary tract infection)    Past Surgical History  Procedure Laterality Date  . Cesarean section    . Salpingectomy      LEFT SIDE   No family history on file. History  Substance Use Topics  . Smoking status: Current Every Day Smoker -- 1.00 packs/day    Types: Cigarettes  . Smokeless tobacco: Not on file  . Alcohol Use: No   OB History    No data available     Review of Systems  Constitutional: Negative for fever, chills and fatigue.  HENT: Negative for trouble swallowing.   Eyes: Negative for visual disturbance.  Respiratory: Negative for shortness of breath.   Cardiovascular: Negative for chest pain and palpitations.  Gastrointestinal: Negative for nausea, vomiting, abdominal pain and diarrhea.  Genitourinary: Negative for dysuria and difficulty urinating.  Musculoskeletal: Positive for arthralgias. Negative for neck pain.  Skin: Positive for wound. Negative for color change.  Neurological: Negative for dizziness and weakness.  Psychiatric/Behavioral: Negative for dysphoric mood.      Allergies  Keflex  Home Medications   Prior to Admission medications    Medication Sig Start Date End Date Taking? Authorizing Provider  ALPRAZolam Prudy Feeler) 1 MG tablet Take 1 mg by mouth 3 (three) times daily.    Historical Provider, MD  FLUoxetine (PROZAC) 40 MG capsule Take 40 mg by mouth daily.    Historical Provider, MD  HYDROcodone-acetaminophen (NORCO) 7.5-325 MG per tablet Take 1 tablet by mouth 4 (four) times daily.    Historical Provider, MD  HYDROcodone-acetaminophen (NORCO/VICODIN) 5-325 MG per tablet Take 1-2 tablets by mouth every 4 (four) hours as needed for moderate pain or severe pain. 10/27/13   Trixie Dredge, PA-C  HYDROcodone-acetaminophen (NORCO/VICODIN) 5-325 MG per tablet Take 1 tablet by mouth every 6 (six) hours as needed for moderate pain. 06/12/14   Luisa Dago, MD  ibuprofen (ADVIL,MOTRIN) 200 MG tablet Take 800 mg by mouth once.    Historical Provider, MD  ibuprofen (ADVIL,MOTRIN) 800 MG tablet Take 1 tablet (800 mg total) by mouth every 8 (eight) hours as needed for mild pain or moderate pain. 10/27/13   Trixie Dredge, PA-C  Menthol, Topical Analgesic, (ICY HOT EX) Apply 1 application topically once. Apply to foot for pain    Historical Provider, MD  Norethin Ace-Eth Estrad-FE (LOMEDIA 24 FE PO) Take 1 tablet by mouth daily.    Historical Provider, MD  ondansetron (ZOFRAN) 4 MG tablet Take 1 tablet (4 mg total) by mouth every 8 (eight) hours as needed for nausea or vomiting. 06/12/14   Luisa Dago, MD   BP 117/67 mmHg  Pulse 86  Temp(Src) 97.4 F (36.3 C) (Oral)  Resp 18  SpO2 97% Physical Exam  Constitutional: She is oriented to person, place, and time. She appears well-developed and well-nourished. No distress.  HENT:  Head: Normocephalic and atraumatic.  Eyes: Conjunctivae are normal.  Neck: Normal range of motion.  Cardiovascular: Normal rate and regular rhythm.  Exam reveals no gallop and no friction rub.   No murmur heard. Pulmonary/Chest: Effort normal and breath sounds normal. She has no wheezes. She has no rales. She  exhibits no tenderness.  Abdominal: Soft. She exhibits no distension. There is no tenderness. There is no rebound.  Musculoskeletal: Normal range of motion.  Generalized edema of right fifth finger. Tenderness to palpation at the PIP. Limited ROM of DIP and PIP or right fifth finger.   Neurological: She is alert and oriented to person, place, and time. Coordination normal.  Speech is goal-oriented. Moves limbs without ataxia.   Skin: Skin is warm and dry.  There is a dime-sized, scabbed wound at the lateral aspect of the fifth finger PIP. There is surrounding erythema. No drainage.   Psychiatric: She has a normal mood and affect. Her behavior is normal.  Nursing note and vitals reviewed.   ED Course  Procedures (including critical care time) Labs Review Labs Reviewed - No data to display  Imaging Review Dg Finger Little Right  08/20/2014   CLINICAL DATA:  Smashed right little finger in door 2 weeks ago with pain and swelling. Initial encounter.  EXAM: RIGHT LITTLE FINGER 2+V  COMPARISON:  None.  FINDINGS: There is no evidence of fracture or dislocation. There is no evidence of arthropathy or other focal bone abnormality. Soft tissues are unremarkable.  IMPRESSION: Negative.   Electronically Signed   By: Tiburcio PeaJonathan  Watts M.D.   On: 08/20/2014 08:03     EKG Interpretation None      MDM   Final diagnoses:  Injury  Injury of fifth finger, right, initial encounter  Skin infection    7:56 AM Patient has a crusted wound on the lateral aspect right fifth finger. Patient will have xray of the affected finger. No neurovascular compromise.   8:11 AM Xray negative for acute changes. Patient will have Vicodin and bactrim for symptoms.   Emilia BeckKaitlyn Azlan Hanway, PA-C 08/20/14 0901  Lyanne CoKevin M Campos, MD 08/20/14 [phone]

## 2014-11-11 ENCOUNTER — Emergency Department (HOSPITAL_COMMUNITY): Payer: 59

## 2014-11-11 ENCOUNTER — Encounter (HOSPITAL_COMMUNITY): Payer: Self-pay | Admitting: Family Medicine

## 2014-11-11 ENCOUNTER — Emergency Department (HOSPITAL_COMMUNITY)
Admission: EM | Admit: 2014-11-11 | Discharge: 2014-11-11 | Disposition: A | Discharge status: Home / Self Care | Payer: 59 | Attending: Emergency Medicine | Admitting: Emergency Medicine

## 2014-11-11 DIAGNOSIS — M791 Myalgia: Secondary | ICD-10-CM | POA: Diagnosis not present

## 2014-11-11 DIAGNOSIS — R05 Cough: Secondary | ICD-10-CM

## 2014-11-11 DIAGNOSIS — Z79899 Other long term (current) drug therapy: Secondary | ICD-10-CM | POA: Diagnosis not present

## 2014-11-11 DIAGNOSIS — Z792 Long term (current) use of antibiotics: Secondary | ICD-10-CM | POA: Insufficient documentation

## 2014-11-11 DIAGNOSIS — F419 Anxiety disorder, unspecified: Secondary | ICD-10-CM | POA: Insufficient documentation

## 2014-11-11 DIAGNOSIS — R630 Anorexia: Secondary | ICD-10-CM | POA: Diagnosis not present

## 2014-11-11 DIAGNOSIS — R059 Cough, unspecified: Secondary | ICD-10-CM

## 2014-11-11 DIAGNOSIS — J40 Bronchitis, not specified as acute or chronic: Secondary | ICD-10-CM | POA: Diagnosis not present

## 2014-11-11 DIAGNOSIS — Z87440 Personal history of urinary (tract) infections: Secondary | ICD-10-CM | POA: Insufficient documentation

## 2014-11-11 DIAGNOSIS — Z720 Tobacco use: Secondary | ICD-10-CM | POA: Diagnosis not present

## 2014-11-11 DIAGNOSIS — R112 Nausea with vomiting, unspecified: Secondary | ICD-10-CM | POA: Diagnosis not present

## 2014-11-11 MED ORDER — PREDNISONE 20 MG PO TABS
60.0000 mg | ORAL_TABLET | Freq: Once | ORAL | Status: AC
  Administered 2014-11-11: 60 mg via ORAL
  Filled 2014-11-11: qty 3

## 2014-11-11 MED ORDER — PREDNISONE 20 MG PO TABS: 40.0000 mg | ORAL_TABLET | Freq: Every day | ORAL | Status: DC

## 2014-11-11 MED ORDER — ALBUTEROL SULFATE (2.5 MG/3ML) 0.083% IN NEBU
5.0000 mg | INHALATION_SOLUTION | Freq: Once | RESPIRATORY_TRACT | Status: AC
  Administered 2014-11-11: 5 mg via RESPIRATORY_TRACT
  Filled 2014-11-11: qty 6

## 2014-11-11 MED ORDER — IPRATROPIUM BROMIDE 0.02 % IN SOLN
0.5000 mg | Freq: Once | RESPIRATORY_TRACT | Status: AC
  Administered 2014-11-11: 0.5 mg via RESPIRATORY_TRACT
  Filled 2014-11-11: qty 2.5

## 2014-11-11 MED ORDER — ALBUTEROL SULFATE HFA 108 (90 BASE) MCG/ACT IN AERS
1.0000 | INHALATION_SPRAY | RESPIRATORY_TRACT | Status: DC | PRN
  Administered 2014-11-11: 1 via RESPIRATORY_TRACT
  Filled 2014-11-11: qty 6.7

## 2014-11-11 MED ORDER — HYDROCOD POLST-CPM POLST ER 10-8 MG/5ML PO SUER: 5.0000 mL | Freq: Two times a day (BID) | ORAL | Status: DC | PRN

## 2014-11-11 NOTE — ED Provider Notes (Signed)
CSN: [id number]     Arrival date & time 11/11/14  1338 History  This chart was scribed for non-physician practitioner, Sharilyn Sites, PA-C working with Mancel Bale, MD, by Jarvis Morgan, ED Scribe. This patient was seen in room TR03C/TR03C and the patient's care was started at 4:38 PM.    Chief Complaint  Patient presents with  . Cough    The history is provided by the patient. No language interpreter was used.    HPI Comments: Natalie Barrera is a 39 y.o. female who presents to the Emergency Department complaining of intermittent, gradually worsening, productive cough for 2 weeks. She is having associated gerneralized myalgias, nausea, vomiting, chills, subjective fever, and decreased appetite. No nausea or vomiting today.  Pt fever upon arrival to the ED was 98 F. Pt states she has had a sick contact through her 2 daughters. Pt denies any h/o asthma or COPD. She states that nothing has been able to relieve her symptoms. She denies any other complaints at this time.  No chest pain or SOB, only mild rib pain with coughing.  No intervention tried PTA.   Past Medical History  Diagnosis Date  . Anxiety   . Ectopic pregnancy   . Chronic UTI (urinary tract infection)    Past Surgical History  Procedure Laterality Date  . Cesarean section    . Salpingectomy      LEFT SIDE   History reviewed. No pertinent family history. History  Substance Use Topics  . Smoking status: Current Every Day Smoker -- 1.00 packs/day    Types: Cigarettes  . Smokeless tobacco: Not on file  . Alcohol Use: No   OB History    No data available     Review of Systems  Constitutional: Positive for fever (subjective; fever in ED was 98 F), chills and appetite change (decreased).  Respiratory: Positive for cough.   Gastrointestinal: Positive for nausea and vomiting.  Musculoskeletal: Positive for myalgias (generalized).  All other systems reviewed and are negative.     Allergies  Keflex  Home  Medications   Prior to Admission medications   Medication Sig Start Date End Date Taking? Authorizing Provider  ALPRAZolam Prudy Feeler) 1 MG tablet Take 1 mg by mouth 3 (three) times daily.    Historical Provider, MD  FLUoxetine (PROZAC) 40 MG capsule Take 40 mg by mouth daily.    Historical Provider, MD  HYDROcodone-acetaminophen (NORCO/VICODIN) 5-325 MG per tablet Take 1-2 tablets by mouth every 4 (four) hours as needed for moderate pain or severe pain. 08/20/14   Kaitlyn Szekalski, PA-C  ibuprofen (ADVIL,MOTRIN) 200 MG tablet Take 800 mg by mouth once.    Historical Provider, MD  ibuprofen (ADVIL,MOTRIN) 800 MG tablet Take 1 tablet (800 mg total) by mouth every 8 (eight) hours as needed for mild pain or moderate pain. 10/27/13   Trixie Dredge, PA-C  Menthol, Topical Analgesic, (ICY HOT EX) Apply 1 application topically once. Apply to foot for pain    Historical Provider, MD  Norethin Ace-Eth Estrad-FE (LOMEDIA 24 FE PO) Take 1 tablet by mouth daily.    Historical Provider, MD  ondansetron (ZOFRAN) 4 MG tablet Take 1 tablet (4 mg total) by mouth every 8 (eight) hours as needed for nausea or vomiting. 06/12/14   Donavan Foil, MD  sulfamethoxazole-trimethoprim (SEPTRA DS) 800-160 MG per tablet Take 1 tablet by mouth every 12 (twelve) hours. 08/20/14   Emilia Beck, PA-C   Triage Vitals: BP 106/70 mmHg  Pulse 74  Temp(Src)  98 F (36.7 C)  Resp 18  SpO2 100%  LMP 11/10/2014  Physical Exam  Constitutional: She is oriented to person, place, and time. She appears well-developed and well-nourished. No distress.  HENT:  Head: Normocephalic and atraumatic.  Right Ear: Tympanic membrane and ear canal normal.  Left Ear: Tympanic membrane and ear canal normal.  Nose: Mucosal edema and rhinorrhea (clear) present.  Mouth/Throat: Uvula is midline, oropharynx is clear and moist and mucous membranes are normal. No oropharyngeal exudate, posterior oropharyngeal edema, posterior oropharyngeal erythema or  tonsillar abscesses.  Tonsils normal in appearance bilaterally without exudate; uvula midline without peritonsillar abscess; handling secretions appropriately; no difficulty swallowing or speaking  Eyes: Conjunctivae and EOM are normal. Pupils are equal, round, and reactive to light.  Neck: Normal range of motion. Neck supple.  Cardiovascular: Normal rate, regular rhythm and normal heart sounds.   Pulmonary/Chest: Effort normal. No respiratory distress. She has no decreased breath sounds. She has wheezes. She has no rhonchi. She has no rales.  Scatter end expiratory wheezes in upper lung fields; no distress noted  Abdominal: Soft. Bowel sounds are normal. There is no tenderness. There is no guarding.  Musculoskeletal: Normal range of motion.  Neurological: She is alert and oriented to person, place, and time.  Skin: Skin is warm and dry. She is not diaphoretic.  Psychiatric: She has a normal mood and affect.  Nursing note and vitals reviewed.   ED Course  Procedures (including critical care time)  DIAGNOSTIC STUDIES: Oxygen Saturation is 100% on RA, normal by my interpretation.    COORDINATION OF CARE: 1:44 PM- Will order a CXR. Pt advised of plan for treatment and pt agrees.  4:45 PM- Will order breathing treatment.  Pt advised of plan for treatment and pt agrees.   Labs Review Labs Reviewed - No data to display  Imaging Review Dg Chest 2 View  11/11/2014   CLINICAL DATA:  Cough.  Congestion and chills.  EXAM: CHEST  2 VIEW  COMPARISON:  03/30/2013  FINDINGS: Fine interstitial opacities bilaterally. No Kerley lines or pleural effusion. Normal cardiomediastinal silhouette.  IMPRESSION: Probable bronchitis.  No focal pneumonia.   Electronically Signed   By: Marnee SpringJonathon  Watts M.D.   On: 11/11/2014 14:50     EKG Interpretation None      MDM   Final diagnoses:  Bronchitis   39 year old female with URI type symptoms over the past 2 weeks. Sick contacts at home with similar  symptoms. On exam, patient is afebrile and nontoxic in appearance. She has expiratory wheezes in her upper lung fields but is in no acute respiratory distress.  She has not had any nausea or vomiting today and her abdominal exam is benign. She does not appear overly dehydrated, mucous membranes remain moist. Chest x-ray was obtained revealing likely bronchitis. Patient was given albuterol and Atrovent nebulizer treatment in the ED as well as dose of prednisone with improvement of her symptoms. VS remain stable on RA.  Will discharge home with prednisone, albuterol inhaler, and Tussionex. She is to follow-up with her PCP.  Discussed plan with patient, he/she acknowledged understanding and agreed with plan of care.  Return precautions given for new or worsening symptoms.  I personally performed the services described in this documentation, which was scribed in my presence. The recorded information has been reviewed and is accurate.  Garlon HatchetLisa M Emmaleah Meroney, PA-C 11/11/14 1816  Mancel BaleElliott Wentz, MD 11/12/14 [phone]

## 2014-11-11 NOTE — ED Notes (Signed)
Pt here for cough x 2 weeks. sts body aches and nausea and chills

## 2014-11-11 NOTE — Discharge Instructions (Signed)
Take the prescribed medication as directed.  Use albuterol inhaler as needed for wheezing or shortness of breath. Follow-up with your primary care physician. Return to the ED for new or worsening symptoms.

## 2015-03-10 ENCOUNTER — Inpatient Hospital Stay (HOSPITAL_COMMUNITY)
Admission: AD | Admit: 2015-03-10 | Discharge: 2015-03-10 | Disposition: A | Discharge status: Home / Self Care | Payer: Medicaid Other | Source: Ambulatory Visit | Attending: Family Medicine | Admitting: Family Medicine

## 2015-03-10 NOTE — MAU Note (Signed)
Not in lobby

## 2015-03-11 ENCOUNTER — Encounter (HOSPITAL_COMMUNITY): Payer: Self-pay | Admitting: Emergency Medicine

## 2015-03-11 ENCOUNTER — Emergency Department (HOSPITAL_COMMUNITY)
Admission: EM | Admit: 2015-03-11 | Discharge: 2015-03-11 | Disposition: A | Discharge status: Home / Self Care | Payer: Medicaid Other | Attending: Emergency Medicine | Admitting: Emergency Medicine

## 2015-03-11 DIAGNOSIS — Z3202 Encounter for pregnancy test, result negative: Secondary | ICD-10-CM | POA: Diagnosis not present

## 2015-03-11 DIAGNOSIS — R1030 Lower abdominal pain, unspecified: Secondary | ICD-10-CM | POA: Diagnosis present

## 2015-03-11 DIAGNOSIS — N12 Tubulo-interstitial nephritis, not specified as acute or chronic: Secondary | ICD-10-CM | POA: Diagnosis not present

## 2015-03-11 DIAGNOSIS — Z720 Tobacco use: Secondary | ICD-10-CM | POA: Insufficient documentation

## 2015-03-11 DIAGNOSIS — F419 Anxiety disorder, unspecified: Secondary | ICD-10-CM | POA: Diagnosis not present

## 2015-03-11 DIAGNOSIS — Z87440 Personal history of urinary (tract) infections: Secondary | ICD-10-CM | POA: Diagnosis not present

## 2015-03-11 DIAGNOSIS — Z79899 Other long term (current) drug therapy: Secondary | ICD-10-CM | POA: Diagnosis not present

## 2015-03-11 LAB — COMPREHENSIVE METABOLIC PANEL
ALT: 31 U/L (ref 14–54)
AST: 21 U/L (ref 15–41)
Albumin: 3.6 g/dL (ref 3.5–5.0)
Alkaline Phosphatase: 99 U/L (ref 38–126)
Anion gap: 8 (ref 5–15)
BUN: 5 mg/dL — AB (ref 6–20)
CHLORIDE: 106 mmol/L (ref 101–111)
CO2: 24 mmol/L (ref 22–32)
CREATININE: 0.55 mg/dL (ref 0.44–1.00)
Calcium: 8.7 mg/dL — ABNORMAL LOW (ref 8.9–10.3)
GFR calc Af Amer: >60 mL/min (ref >60)
GFR calc non Af Amer: >60 mL/min (ref >60)
Glucose, Bld: 98 mg/dL (ref 65–99)
Potassium: 4 mmol/L (ref 3.5–5.1)
SODIUM: 138 mmol/L (ref 135–145)
Total Bilirubin: 0.4 mg/dL (ref 0.3–1.2)
Total Protein: 6.4 g/dL — ABNORMAL LOW (ref 6.5–8.1)

## 2015-03-11 LAB — CBC WITH DIFFERENTIAL/PLATELET
Basophils Absolute: 0 10*3/uL (ref 0.0–0.1)
Basophils Relative: 0 % (ref 0–1)
EOS ABS: 0.1 10*3/uL (ref 0.0–0.7)
EOS PCT: 1 % (ref 0–5)
HCT: 36.2 % (ref 36.0–46.0)
HEMOGLOBIN: 12.1 g/dL (ref 12.0–15.0)
LYMPHS ABS: 1.5 10*3/uL (ref 0.7–4.0)
Lymphocytes Relative: 18 % (ref 12–46)
MCH: 29.7 pg (ref 26.0–34.0)
MCHC: 33.4 g/dL (ref 30.0–36.0)
MCV: 88.7 fL (ref 78.0–100.0)
MONO ABS: 0.4 10*3/uL (ref 0.1–1.0)
MONOS PCT: 5 % (ref 3–12)
Neutro Abs: 6.4 10*3/uL (ref 1.7–7.7)
Neutrophils Relative %: 76 % (ref 43–77)
PLATELETS: 281 10*3/uL (ref 150–400)
RBC: 4.08 MIL/uL (ref 3.87–5.11)
RDW: 15.7 % — ABNORMAL HIGH (ref 11.5–15.5)
WBC: 8.4 10*3/uL (ref 4.0–10.5)

## 2015-03-11 LAB — URINALYSIS, ROUTINE W REFLEX MICROSCOPIC
Bilirubin Urine: NEGATIVE
GLUCOSE, UA: NEGATIVE mg/dL
HGB URINE DIPSTICK: NEGATIVE
Ketones, ur: NEGATIVE mg/dL
Nitrite: POSITIVE — AB
PROTEIN: NEGATIVE mg/dL
Specific Gravity, Urine: 1.017 (ref 1.005–1.030)
Urobilinogen, UA: 0.2 mg/dL (ref 0.0–1.0)
pH: 6 (ref 5.0–8.0)

## 2015-03-11 LAB — URINE MICROSCOPIC-ADD ON

## 2015-03-11 LAB — POC URINE PREG, ED: Preg Test, Ur: NEGATIVE

## 2015-03-11 MED ORDER — ONDANSETRON HCL 4 MG/2ML IJ SOLN
4.0000 mg | Freq: Once | INTRAMUSCULAR | Status: AC
  Administered 2015-03-11: 4 mg via INTRAVENOUS
  Filled 2015-03-11: qty 2

## 2015-03-11 MED ORDER — ONDANSETRON HCL 4 MG PO TABS: 4.0000 mg | ORAL_TABLET | Freq: Four times a day (QID) | ORAL | Status: DC

## 2015-03-11 MED ORDER — SODIUM CHLORIDE 0.9 % IV BOLUS (SEPSIS)
1000.0000 mL | Freq: Once | INTRAVENOUS | Status: AC
  Administered 2015-03-11: 1000 mL via INTRAVENOUS

## 2015-03-11 MED ORDER — IBUPROFEN 800 MG PO TABS
800.0000 mg | ORAL_TABLET | Freq: Once | ORAL | Status: DC
  Filled 2015-03-11: qty 1

## 2015-03-11 MED ORDER — CIPROFLOXACIN HCL 500 MG PO TABS: 500.0000 mg | ORAL_TABLET | Freq: Two times a day (BID) | ORAL | Status: DC

## 2015-03-11 MED ORDER — CIPROFLOXACIN IN D5W 400 MG/200ML IV SOLN
400.0000 mg | Freq: Once | INTRAVENOUS | Status: AC
  Administered 2015-03-11: 400 mg via INTRAVENOUS
  Filled 2015-03-11: qty 200

## 2015-03-11 NOTE — ED Notes (Signed)
Pt is in stable condition upon d/c and ambulates from ED. 

## 2015-03-11 NOTE — ED Notes (Signed)
Pt reports LLQ pain tender upon palpation. Reports going on since last night; came but left due to wait times. Reports no urinary symptoms. Hx of ovarian cysts but does not feel like this. No hx of kidney stones. Still has appendix and gallbladder. Chills but denies fevers.

## 2015-03-11 NOTE — ED Provider Notes (Signed)
CSN: [id number]     Arrival date & time 03/11/15  1016 History   First MD Initiated Contact with Patient 03/11/15 1340     Chief Complaint  Patient presents with  . Abdominal Pain  . Flank Pain     (Consider location/radiation/quality/duration/timing/severity/associated sxs/prior Treatment) Patient is a 39 y.o. female presenting with abdominal pain.  Abdominal Pain Pain location:  Suprapubic Pain quality: aching   Pain severity:  Mild Onset quality:  Gradual Duration:  1 day Timing:  Constant Progression:  Worsening Chronicity:  New Context: not alcohol use, not previous surgeries and not recent illness   Relieved by:  None tried Worsened by:  Nothing tried Ineffective treatments:  None tried Associated symptoms: chills and dysuria   Associated symptoms: no chest pain, no fatigue and no fever   Risk factors: no alcohol abuse, no NSAID use and not obese     Past Medical History  Diagnosis Date  . Anxiety   . Ectopic pregnancy   . Chronic UTI (urinary tract infection)    Past Surgical History  Procedure Laterality Date  . Cesarean section    . Salpingectomy      LEFT SIDE   History reviewed. No pertinent family history. Social History  Substance Use Topics  . Smoking status: Current Every Day Smoker -- 1.00 packs/day    Types: Cigarettes  . Smokeless tobacco: None  . Alcohol Use: No   OB History    No data available     Review of Systems  Constitutional: Positive for chills. Negative for fever and fatigue.  Cardiovascular: Negative for chest pain.  Gastrointestinal: Positive for abdominal pain.  Endocrine: Positive for polyuria.  Genitourinary: Positive for dysuria and difficulty urinating.  Musculoskeletal: Positive for back pain.  All other systems reviewed and are negative.     Allergies  Keflex and Sulfa antibiotics  Home Medications   Prior to Admission medications   Medication Sig Start Date End Date Taking? Authorizing Provider  ALPRAZolam  Prudy Feeler) 1 MG tablet Take 1 mg by mouth 3 (three) times daily.    Historical Provider, MD  chlorpheniramine-HYDROcodone (TUSSIONEX PENNKINETIC ER) 10-8 MG/5ML SUER Take 5 mLs by mouth every 12 (twelve) hours as needed for cough. 11/11/14   Garlon Hatchet, PA-C  ciprofloxacin (CIPRO) 500 MG tablet Take 1 tablet (500 mg total) by mouth 2 (two) times daily. One po bid x 7 days 03/11/15   Marily Memos, MD  FLUoxetine (PROZAC) 40 MG capsule Take 40 mg by mouth daily.    Historical Provider, MD  HYDROcodone-acetaminophen (NORCO/VICODIN) 5-325 MG per tablet Take 1-2 tablets by mouth every 4 (four) hours as needed for moderate pain or severe pain. 08/20/14   Kaitlyn Szekalski, PA-C  ibuprofen (ADVIL,MOTRIN) 200 MG tablet Take 800 mg by mouth once.    Historical Provider, MD  ibuprofen (ADVIL,MOTRIN) 800 MG tablet Take 1 tablet (800 mg total) by mouth every 8 (eight) hours as needed for mild pain or moderate pain. 10/27/13   Trixie Dredge, PA-C  Menthol, Topical Analgesic, (ICY HOT EX) Apply 1 application topically once. Apply to foot for pain    Historical Provider, MD  Norethin Ace-Eth Estrad-FE (LOMEDIA 24 FE PO) Take 1 tablet by mouth daily.    Historical Provider, MD  ondansetron (ZOFRAN) 4 MG tablet Take 1 tablet (4 mg total) by mouth every 6 (six) hours. 03/11/15   Marily Memos, MD  predniSONE (DELTASONE) 20 MG tablet Take 2 tablets (40 mg total) by mouth daily. Take  40 mg by mouth daily for 3 days, then  by mouth daily for 3 days, then  daily for 3 days 11/11/14   Garlon Hatchet, PA-C  sulfamethoxazole-trimethoprim (SEPTRA DS) 800-160 MG per tablet Take 1 tablet by mouth every 12 (twelve) hours. 08/20/14   Kaitlyn Szekalski, PA-C   BP 99/62 mmHg  Pulse 90  Temp(Src) 97.8 F (36.6 C) (Oral)  Resp 18  SpO2 97%  LMP 02/17/2015 Physical Exam  Constitutional: She is oriented to person, place, and time. She appears well-developed and well-nourished.  HENT:  Head: Normocephalic and atraumatic.  Eyes:  Conjunctivae and EOM are normal. Right eye exhibits no discharge. Left eye exhibits no discharge.  Cardiovascular: Normal rate and regular rhythm.   Pulmonary/Chest: Effort normal and breath sounds normal. No respiratory distress.  Abdominal: Soft. She exhibits no distension. There is no tenderness. There is no rebound.  Musculoskeletal: Normal range of motion. She exhibits no edema or tenderness.  Neurological: She is alert and oriented to person, place, and time.  Skin: Skin is warm and dry.  Nursing note and vitals reviewed.   ED Course  Procedures (including critical care time) Labs Review Labs Reviewed  COMPREHENSIVE METABOLIC PANEL - Abnormal; Notable for the following:    BUN 5 (*)    Calcium 8.7 (*)    Total Protein 6.4 (*)    All other components within normal limits  CBC WITH DIFFERENTIAL/PLATELET - Abnormal; Notable for the following:    RDW 15.7 (*)    All other components within normal limits  URINALYSIS, ROUTINE W REFLEX MICROSCOPIC (NOT AT [facility]) - Abnormal; Notable for the following:    APPearance CLOUDY (*)    Nitrite POSITIVE (*)    Leukocytes, UA MODERATE (*)    All other components within normal limits  URINE MICROSCOPIC-ADD ON - Abnormal; Notable for the following:    Squamous Epithelial / LPF FEW (*)    Bacteria, UA MANY (*)    All other components within normal limits  POC URINE PREG, ED    Imaging Review No results found. I have personally reviewed and evaluated these images and lab results as part of my medical decision-making.   EKG Interpretation None      MDM   Final diagnoses:  Pyelonephritis   Female with 1 day of chills belly pain and then progressively developing bilateral back pain as well. Exam relatively benign aside from as documented above. Patient not septic or in distress. Likely pyelonephritis will treat her appropriately. Reevaluation patient's symptoms improved vitals still stable and has had ciprofloxacin secondary to Keflex  and sulfa allergy. We'll DC on same with PCP follow-up.  I have personally and contemperaneously reviewed labs and imaging and used in my decision making as above.   A medical screening exam was performed and I feel the patient has had an appropriate workup for their chief complaint at this time and likelihood of emergent condition existing is low. They have been counseled on decision, discharge, follow up and which symptoms necessitate immediate return to the emergency department. They or their family verbally stated understanding and agreement with plan and discharged in stable condition.      Marily Memos, MD 03/11/15 [phone]

## 2015-03-11 NOTE — ED Notes (Signed)
Pt is still receiving antibiotics.

## 2015-07-10 ENCOUNTER — Encounter: Payer: Self-pay | Admitting: Physical Medicine & Rehabilitation

## 2015-07-17 ENCOUNTER — Ambulatory Visit: Payer: 59 | Admitting: Physical Medicine & Rehabilitation

## 2015-08-12 ENCOUNTER — Encounter: Payer: Medicaid Other | Attending: Physical Medicine & Rehabilitation

## 2015-08-12 ENCOUNTER — Encounter: Payer: Self-pay | Admitting: Physical Medicine & Rehabilitation

## 2015-08-12 ENCOUNTER — Ambulatory Visit (HOSPITAL_BASED_OUTPATIENT_CLINIC_OR_DEPARTMENT_OTHER): Payer: Medicaid Other | Admitting: Physical Medicine & Rehabilitation

## 2015-08-12 VITALS — BP 121/69 | HR 88 | Resp 16

## 2015-08-12 DIAGNOSIS — M25511 Pain in right shoulder: Secondary | ICD-10-CM | POA: Insufficient documentation

## 2015-08-12 DIAGNOSIS — M7918 Myalgia, other site: Secondary | ICD-10-CM

## 2015-08-12 DIAGNOSIS — Z5181 Encounter for therapeutic drug level monitoring: Secondary | ICD-10-CM | POA: Insufficient documentation

## 2015-08-12 DIAGNOSIS — M791 Myalgia: Secondary | ICD-10-CM | POA: Diagnosis not present

## 2015-08-12 DIAGNOSIS — M25551 Pain in right hip: Secondary | ICD-10-CM | POA: Diagnosis not present

## 2015-08-12 DIAGNOSIS — G894 Chronic pain syndrome: Secondary | ICD-10-CM | POA: Insufficient documentation

## 2015-08-12 DIAGNOSIS — Z79899 Other long term (current) drug therapy: Secondary | ICD-10-CM

## 2015-08-12 DIAGNOSIS — F419 Anxiety disorder, unspecified: Secondary | ICD-10-CM | POA: Insufficient documentation

## 2015-08-12 DIAGNOSIS — M545 Low back pain: Secondary | ICD-10-CM | POA: Diagnosis present

## 2015-08-12 DIAGNOSIS — M7061 Trochanteric bursitis, right hip: Secondary | ICD-10-CM | POA: Insufficient documentation

## 2015-08-12 DIAGNOSIS — G8929 Other chronic pain: Secondary | ICD-10-CM | POA: Diagnosis not present

## 2015-08-12 DIAGNOSIS — F17210 Nicotine dependence, cigarettes, uncomplicated: Secondary | ICD-10-CM | POA: Insufficient documentation

## 2015-08-12 DIAGNOSIS — M25559 Pain in unspecified hip: Secondary | ICD-10-CM

## 2015-08-12 MED ORDER — CYCLOBENZAPRINE HCL 5 MG PO TABS: 5.0000 mg | ORAL_TABLET | Freq: Three times a day (TID) | ORAL | Status: DC | PRN

## 2015-08-12 NOTE — Patient Instructions (Signed)
Flexeril for muscle pain.  Do not advise hydrocodone  Physical therapy ordered they will call you  Injection may take a couple days to take full effect

## 2015-08-12 NOTE — Progress Notes (Signed)
Subjective:    Patient ID: Natalie Barrera, female    DOB: 11/04/1975, 40 y.o.   MRN: [id number]  HPI Chief complaint is right shoulder pain Secondary complaint low back pain radiating into the right hip  Shoulder pain started after her mother died in Dec 08, 2009. She has been seen by several MDs. MRI of the right shoulder in 12/09/2010 showed some tendinosis of the infra and supraspinatus tendons but no other abnormalities.  Patient received several cortisone injections but these were not helpful.  Has not tried any physical therapy.  Also has been on anti-inflammatories as well as muscle relaxers in was on hydrocodone 7.5 mg every 6 hours. Patient always has a burning sensation in her neck. Her shoulder pain sometimes goes down her arm as well. Patient also has used icy hot cream Has also tried Tylenol and ibuprofen. Cymbalta is helpful for shoulder pain, take 60 mg per day It does not help for her low back pain.  Patient states her back pain started before her shoulder. No history of injury. As always been more on the right side. Patient has had no imaging studies in the [facility] system. No lower extremity weakness numbness No loss of bowel or bladder function  Past medical history anxiety and depression, bipolar disorder Pain Inventory Average Pain 10 Pain Right Now 7 My pain is burning and stabbing  In the last 24 hours, has pain interfered with the following? General activity 7 Relation with others 10 Enjoyment of life 10 What TIME of day is your pain at its worst? evening Sleep (in general) Poor  Pain is worse with: walking and bending Pain improves with: rest and medication Relief from Meds: 7  Mobility walk without assistance how many minutes can you walk? 20 ability to climb steps?  yes do you drive?  yes  Function not employed: date last employed .  Neuro/Psych spasms depression anxiety  Prior Studies x-rays CT/MRI new visit RADIOLOGY REPORT*  Clinical  Data: Right shoulder pain and burning for 6 months.  MRI OF THE RIGHT SHOULDER WITHOUT CONTRAST  Technique: Multiplanar, multisequence MR imaging was performed. No intravenous contrast was administered.  Comparison: Radiographs dated 11/26/2010  Findings: There is focal tendinosis of the distal infraspinatus and supraspinatus tendons. There is no rotator cuff tear. No atrophy or edema in the muscles of the rotator cuff.  Normal type 1 acromion. No degenerative changes of the acromioclavicular joint.  Labrum and biceps tendon are normal. No joint effusion.  There is a 2.3 x 1.4 by 1.4 cm lesion in the greater tuberosity of the proximal humerus. I suspect this represents an intraosseous ganglion. This could less likely represent a chondroid lesion.  IMPRESSION: Focal tendinosis of the distal infraspinatus and supraspinatus tendons.  Probable intraosseous ganglion at the greater tuberosity, not felt to be of clinical significance. This could be followed with the routine shoulder x-rays in a 6 months for confirmation.  Otherwise, normal exam.  Original Report Authenticated By: Gwynn Burly, M.D.  Reviewed CT of the abdomen and pelvis 05/26/2013. This showed no evidence of central canal stenosis that was some disc bulging at L5-S1. Sacroiliac joints looked okay  Scout films including bilateral hips showed no evidence of hip osteoarthritis.  Physicians involved in your care Primary care Dr. Bruna Potter new visit   History reviewed. No pertinent family history. Social History   Social History  . Marital Status: Married    Spouse Name: N/A  . Number of Children: N/A  .  Years of Education: N/A   Social History Main Topics  . Smoking status: Current Every Day Smoker -- 1.00 packs/day    Types: Cigarettes  . Smokeless tobacco: None  . Alcohol Use: No  . Drug Use: No  . Sexual Activity: Not Asked   Other Topics Concern  . None   Social History  Narrative   Past Surgical History  Procedure Laterality Date  . Cesarean section    . Salpingectomy      LEFT SIDE   Past Medical History  Diagnosis Date  . Anxiety   . Ectopic pregnancy   . Chronic UTI (urinary tract infection)    BP 121/69 mmHg  Pulse 88  Resp 16  SpO2 96%  Opioid Risk Score:   Fall Risk Score:  `1  Depression screen PHQ 2/9  Depression screen PHQ 2/9 08/12/2015  Decreased Interest 1  Down, Depressed, Hopeless 2  PHQ - 2 Score 3  Altered sleeping 3  Tired, decreased energy 3  Change in appetite 3  Feeling bad or failure about yourself  2  Trouble concentrating 3  Moving slowly or fidgety/restless 1  Suicidal thoughts 1  PHQ-9 Score 19  Difficult doing work/chores Extremely dIfficult     Review of Systems  All other systems reviewed and are negative.      Objective:   Physical Exam  Constitutional: She is oriented to person, place, and time. She appears well-developed and well-nourished.  HENT:  Head: Normocephalic and atraumatic.  Eyes: Conjunctivae and EOM are normal. Pupils are equal, round, and reactive to light.  Cardiovascular: Normal rate and regular rhythm.   Pulmonary/Chest: Effort normal and breath sounds normal.  Abdominal: Soft. Bowel sounds are normal.  Neurological: She is alert and oriented to person, place, and time. She has normal strength. She displays no tremor. No sensory deficit. Coordination normal.  Reflex Scores:      Tricep reflexes are 1+ on the right side and 1+ on the left side.      Bicep reflexes are 1+ on the right side and 1+ on the left side.      Brachioradialis reflexes are 1+ on the right side and 1+ on the left side.      Patellar reflexes are 1+ on the right side and 2+ on the left side.      Achilles reflexes are 1+ on the right side and 1+ on the left side. Psychiatric: Her affect is blunt. Her speech is delayed. She is slowed and withdrawn.  Nursing note and vitals reviewed.   Limited external  rotation bilateral hips. Pain in the right groin as well as lateral thigh area. Motor strength 5/5 bilateral deltoids, biceps, triceps, grip, hip flexor, knee extensor, ankle dorsi flexors and plantar flexor Sensation intact in C5 C6 C7 C8 S1-L2 L3-L4 L5 dermatome distribution  Negative straight leg raising Spine shows no evidence of scoliosis. Cervical thoracic and lumbar range of motion within normal limits flexion extension lateral bending and rotation.  Tenderness palpation in the right upper trapezius right levator, right infraspinatus     Assessment & Plan:  1. Chronic shoulder pain this appears to be mainly myofascial, Patient has had some relief with Flexeril will write for this and send her to therapy. Other treatment options would be acupuncture although this may not be covered by her insurance  2. Low back/right hip pain. It appears the majority of pain is emanating from the hip area.  This is most likely a combination of  A . Right trochanteric bursitis  Trochanteric bursa injection  without ultrasound guidance  Indication Trochanteric bursitis. Exam has tenderness over the greater trochanter of the hip. Pain has not responded to conservative care such as exercise therapy and oral medications. Pain interferes with sleep or with mobility Informed consent was obtained after describing risks and benefits of the procedure with the patient these include bleeding bruising and infection. Patient has signed written consent form. Patient placed in a lateral decubitus position with the affected hip superior. Point of maximal pain was palpated marked and prepped with Betadine and entered with a needle to bone contact. Needle slightly withdrawn then  of betamethasone with 4 cc 1% lidocaine were injected. Patient tolerated procedure well. Post procedure instructions given.  B. Possible right intra-articular hip pathology.  We will inject trochanteric bursa With steroid today  Order  right hip complete films and if this is negative consider MRI of the hip  No imaging studies needed for the right shoulder

## 2015-08-15 ENCOUNTER — Ambulatory Visit: Payer: Medicaid Other | Attending: Physical Medicine & Rehabilitation

## 2015-08-19 LAB — TOXASSURE SELECT,+ANTIDEPR,UR: PDF: 0

## 2015-08-19 NOTE — Progress Notes (Signed)
Urine drug screen for this encounter is consistent for prescribed medication. Alprazolam present but has been prescribed.

## 2015-08-28 ENCOUNTER — Encounter (HOSPITAL_COMMUNITY): Payer: Self-pay

## 2015-08-28 ENCOUNTER — Emergency Department (HOSPITAL_COMMUNITY)
Admission: EM | Admit: 2015-08-28 | Discharge: 2015-08-28 | Disposition: A | Discharge status: Home / Self Care | Payer: Medicaid Other | Attending: Emergency Medicine | Admitting: Emergency Medicine

## 2015-08-28 DIAGNOSIS — M25559 Pain in unspecified hip: Secondary | ICD-10-CM

## 2015-08-28 DIAGNOSIS — M25552 Pain in left hip: Secondary | ICD-10-CM | POA: Insufficient documentation

## 2015-08-28 DIAGNOSIS — G8929 Other chronic pain: Secondary | ICD-10-CM

## 2015-08-28 DIAGNOSIS — F419 Anxiety disorder, unspecified: Secondary | ICD-10-CM | POA: Insufficient documentation

## 2015-08-28 DIAGNOSIS — M25551 Pain in right hip: Secondary | ICD-10-CM | POA: Insufficient documentation

## 2015-08-28 DIAGNOSIS — M545 Low back pain: Secondary | ICD-10-CM | POA: Insufficient documentation

## 2015-08-28 DIAGNOSIS — F17210 Nicotine dependence, cigarettes, uncomplicated: Secondary | ICD-10-CM | POA: Insufficient documentation

## 2015-08-28 DIAGNOSIS — Z79899 Other long term (current) drug therapy: Secondary | ICD-10-CM | POA: Diagnosis not present

## 2015-08-28 DIAGNOSIS — Z87440 Personal history of urinary (tract) infections: Secondary | ICD-10-CM | POA: Diagnosis not present

## 2015-08-28 MED ORDER — METHOCARBAMOL 500 MG PO TABS
750.0000 mg | ORAL_TABLET | Freq: Once | ORAL | Status: DC
  Filled 2015-08-28: qty 2

## 2015-08-28 MED ORDER — METHOCARBAMOL 500 MG PO TABS: 1000.0000 mg | ORAL_TABLET | Freq: Four times a day (QID) | ORAL | Status: DC | PRN

## 2015-08-28 NOTE — ED Provider Notes (Signed)
CSN: [id number]     Arrival date & time 08/28/15  0912 History  By signing my name below, I, Essence Howell, attest that this documentation has been prepared under the direction and in the presence of Wynetta Emery, PA-C Electronically Signed: Charline Bills, ED Scribe 08/28/2015 at 10:13 AM.   Chief Complaint  Patient presents with  . Hip Pain                The history is provided by the patient. No language interpreter was used.   HPI Comments: Natalie Barrera is a 40 y.o. female who presents to the Emergency Department complaining of constant, 7/10 low back pain for the past month. No known injury. Pt reports that low back pain radiates into bilateral hips. She was seen by her Claudette Laws, MD with pain management 1 month ago for the same and was given a cortisone injection which she states did not provide relief. Pt has also tried Tylenol and ibuprofen without relief. She denies fever, bladder/bowel incontinence.   Past Medical History  Diagnosis Date  . Anxiety   . Ectopic pregnancy   . Chronic UTI (urinary tract infection)    Past Surgical History  Procedure Laterality Date  . Cesarean section    . Salpingectomy      LEFT SIDE   No family history on file. Social History  Substance Use Topics  . Smoking status: Current Every Day Smoker -- 1.00 packs/day    Types: Cigarettes  . Smokeless tobacco: None  . Alcohol Use: No   OB History    No data available     Review of Systems A complete 10 system review of systems was obtained and all systems are negative except as noted in the HPI and PMH.   Allergies  Keflex and Sulfa antibiotics  Home Medications   Prior to Admission medications   Medication Sig Start Date End Date Taking? Authorizing Provider  ALPRAZolam Prudy Feeler) 1 MG tablet Take 1 mg by mouth 3 (three) times daily.    Historical Provider, MD  buPROPion (WELLBUTRIN XL) 300 MG 24 hr tablet Take 300 mg by mouth daily.    Historical Provider, MD   cyclobenzaprine (FLEXERIL) 5 MG tablet Take 1 tablet (5 mg total) by mouth 3 (three) times daily as needed for muscle spasms. 08/12/15   Erick Colace, MD  DULoxetine (CYMBALTA) 60 MG capsule Take 60 mg by mouth daily.    Historical Provider, MD  Menthol, Topical Analgesic, (ICY HOT EX) Apply 1 application topically once. Apply to foot for pain    Historical Provider, MD  risperiDONE (RISPERDAL) 1 MG tablet Take 1 mg by mouth 2 (two) times daily.    Historical Provider, MD  rOPINIRole (REQUIP) 0.5 MG tablet Take 0.5 mg by mouth at bedtime.    Historical Provider, MD   BP 126/86 mmHg  Pulse 100  Temp(Src) 97.6 F (36.4 C) (Oral)  Resp 18  SpO2 97% Physical Exam  Constitutional: She is oriented to person, place, and time. She appears well-developed and well-nourished. No distress.  HENT:  Head: Normocephalic and atraumatic.  Eyes: Conjunctivae are normal.  Neck: Normal range of motion. No tracheal deviation present.  Cardiovascular: Normal rate, regular rhythm and intact distal pulses.   Pulmonary/Chest: Effort normal.  Abdominal: Soft. There is no tenderness.  Musculoskeletal: Normal range of motion.       Back:  Neurological: She is alert and oriented to person, place, and time.  No point tenderness to  percussion of lumbar spinal processes.  No TTP or paraspinal muscular spasm. Strength is 5 out of 5 to bilateral lower extremities at hip and knee; extensor hallucis longus 5 out of 5. Ankle strength 5 out of 5, no clonus, neurovascularly intact. No saddle anaesthesia. Patellar reflexes are 2+ bilaterally.    Ambulates with a coordinated and nonantalgic gait  Skin: Skin is warm and dry.  Psychiatric: She has a normal mood and affect. Her behavior is normal.  Nursing note and vitals reviewed.  ED Course  Procedures (including critical care time) DIAGNOSTIC STUDIES: Oxygen Saturation is 97% on RA, normal by my interpretation.    COORDINATION OF CARE: 10:06 AM-Discussed  treatment plan which includes Robaxin with pt at bedside and pt agreed to plan.   Labs Review Labs Reviewed - No data to display  Imaging Review No results found.   EKG Interpretation None      MDM   Final diagnoses:  Chronic hip pain, unspecified laterality    Filed Vitals:   08/28/15 0926  BP: 126/86  Pulse: 100  Temp: 97.6 F (36.4 C)  TempSrc: Oral  Resp: 18  SpO2: 97%     Natalie Barrera is 40 y.o. female presenting with low back and bilateral hip pain. Patient's been seen in pain management for this. There is been no new trauma, she's neurovascularly intact. Offered patient Robaxin and she has declined. Advised her to follow with pain management.  Evaluation does not show pathology that would require ongoing emergent intervention or inpatient treatment. Pt is hemodynamically stable and mentating appropriately. Discussed findings and plan with patient/guardian, who agrees with care plan. All questions answered. Return precautions discussed and outpatient follow up given.   Discharge Medication List as of 08/28/2015 10:10 AM    START taking these medications   Details  methocarbamol (ROBAXIN) 500 MG tablet Take 2 tablets (1,000 mg total) by mouth 4 (four) times daily as needed (Pain)., Starting 08/28/2015, Until Discontinued, Print         I personally performed the services described in this documentation, which was scribed in my presence. The recorded information has been reviewed and is accurate.    Wynetta Emery, PA-C 08/28/15 1614  Arby Barrette, MD 08/29/15 [phone]

## 2015-08-28 NOTE — ED Notes (Signed)
Patient here with ongoing bilateral hip and back pain. Seen by her MD and has been treated for bursitis.

## 2015-08-28 NOTE — Discharge Instructions (Signed)
For pain control you may take up to  of Motrin (also known as ibuprofen). That is usually 4 over the counter pills,  3 times a day. Take with food to minimize stomach irritation   You can also take  tylenol (acetaminophen)  (this is 3 over the counter pills) four times a day. Do not drink alcohol or combine with other medications that have acetaminophen as an ingredient (Read the labels!).    For breakthrough pain you may take Robaxin. Do not drink alcohol, drive or operate heavy machinery when taking Robaxin.  Please follow with your primary care doctor in the next 2 days for a check-up. They must obtain records for further management.   Do not hesitate to return to the Emergency Department for any new, worsening or concerning symptoms.    Chronic Pain Chronic pain can be defined as pain that is off and on and lasts for 3-6 months or longer. Many things cause chronic pain, which can make it difficult to make a diagnosis. There are many treatment options available for chronic pain. However, finding a treatment that works well for you may require trying various approaches until the right one is found. Many people benefit from a combination of two or more types of treatment to control their pain. SYMPTOMS  Chronic pain can occur anywhere in the body and can range from mild to very severe. Some types of chronic pain include:  Headache.  Low back pain.  Cancer pain.  Arthritis pain.  Neurogenic pain. This is pain resulting from damage to nerves. People with chronic pain may also have other symptoms such as:  Depression.  Anger.  Insomnia.  Anxiety. DIAGNOSIS  Your health care provider will help diagnose your condition over time. In many cases, the initial focus will be on excluding possible conditions that could be causing the pain. Depending on your symptoms, your health care provider may order tests to diagnose your condition. Some of these tests may include:   Blood  tests.   CT scan.   MRI.   X-rays.   Ultrasounds.   Nerve conduction studies.  You may need to see a specialist.  TREATMENT  Finding treatment that works well may take time. You may be referred to a pain specialist. He or she may prescribe medicine or therapies, such as:   Mindful meditation or yoga.  Shots (injections) of numbing or pain-relieving medicines into the spine or area of pain.  Local electrical stimulation.  Acupuncture.   Massage therapy.   Aroma, color, light, or sound therapy.   Biofeedback.   Working with a physical therapist to keep from getting stiff.   Regular, gentle exercise.   Cognitive or behavioral therapy.   Group support.  Sometimes, surgery may be recommended.  HOME CARE INSTRUCTIONS   Take all medicines as directed by your health care provider.   Lessen stress in your life by relaxing and doing things such as listening to calming music.   Exercise or be active as directed by your health care provider.   Eat a healthy diet and include things such as vegetables, fruits, fish, and lean meats in your diet.   Keep all follow-up appointments with your health care provider.   Attend a support group with others suffering from chronic pain. SEEK MEDICAL CARE IF:   Your pain gets worse.   You develop a new pain that was not there before.   You cannot tolerate medicines given to you by your health care provider.  You have new symptoms since your last visit with your health care provider.  SEEK IMMEDIATE MEDICAL CARE IF:   You feel weak.   You have decreased sensation or numbness.   You lose control of bowel or bladder function.   Your pain suddenly gets much worse.   You develop shaking.  You develop chills.  You develop confusion.  You develop chest pain.  You develop shortness of breath.  MAKE SURE YOU:  Understand these instructions.  Will watch your condition.  Will get help right  away if you are not doing well or get worse.   This information is not intended to replace advice given to you by your health care provider. Make sure you discuss any questions you have with your health care provider.   Document Released: 03/27/2002 Document Revised: 03/07/2013 Document Reviewed: 12/29/2012 Elsevier Interactive Patient Education [employer]2016 [employer] Inc.

## 2015-08-29 ENCOUNTER — Encounter: Payer: Self-pay | Admitting: Physical Medicine & Rehabilitation

## 2015-08-29 ENCOUNTER — Ambulatory Visit (HOSPITAL_BASED_OUTPATIENT_CLINIC_OR_DEPARTMENT_OTHER): Payer: Medicaid Other | Admitting: Physical Medicine & Rehabilitation

## 2015-08-29 ENCOUNTER — Encounter: Payer: Medicaid Other | Attending: Physical Medicine & Rehabilitation

## 2015-08-29 ENCOUNTER — Ambulatory Visit
Admission: RE | Admit: 2015-08-29 | Discharge: 2015-08-29 | Disposition: A | Discharge status: Home / Self Care | Payer: Medicaid Other | Source: Ambulatory Visit | Attending: Physical Medicine & Rehabilitation | Admitting: Physical Medicine & Rehabilitation

## 2015-08-29 VITALS — BP 124/81 | HR 86 | Resp 14

## 2015-08-29 DIAGNOSIS — G8929 Other chronic pain: Secondary | ICD-10-CM | POA: Diagnosis not present

## 2015-08-29 DIAGNOSIS — Z5181 Encounter for therapeutic drug level monitoring: Secondary | ICD-10-CM | POA: Diagnosis not present

## 2015-08-29 DIAGNOSIS — Z79899 Other long term (current) drug therapy: Secondary | ICD-10-CM | POA: Insufficient documentation

## 2015-08-29 DIAGNOSIS — M25551 Pain in right hip: Principal | ICD-10-CM

## 2015-08-29 DIAGNOSIS — M7061 Trochanteric bursitis, right hip: Secondary | ICD-10-CM | POA: Insufficient documentation

## 2015-08-29 DIAGNOSIS — M545 Low back pain: Secondary | ICD-10-CM

## 2015-08-29 DIAGNOSIS — M791 Myalgia: Secondary | ICD-10-CM | POA: Insufficient documentation

## 2015-08-29 DIAGNOSIS — F419 Anxiety disorder, unspecified: Secondary | ICD-10-CM | POA: Insufficient documentation

## 2015-08-29 DIAGNOSIS — G894 Chronic pain syndrome: Secondary | ICD-10-CM | POA: Insufficient documentation

## 2015-08-29 DIAGNOSIS — F17210 Nicotine dependence, cigarettes, uncomplicated: Secondary | ICD-10-CM | POA: Insufficient documentation

## 2015-08-29 DIAGNOSIS — M25511 Pain in right shoulder: Secondary | ICD-10-CM | POA: Insufficient documentation

## 2015-08-29 MED ORDER — TIZANIDINE HCL 2 MG PO CAPS: 2.0000 mg | ORAL_CAPSULE | Freq: Three times a day (TID) | ORAL | Status: DC

## 2015-08-29 NOTE — Progress Notes (Signed)
Subjective:    Patient ID: Natalie Barrera, female    DOB: [birth year], 40 y.o.   MRN: [id number]  HPI 40 year old female with chronic hip and buttock pain. Ordered right hip x-ray has not been completed. Went to emergency Department [facility]. Treated with muscle relaxer and released. Had right trochanteric bursa injection last month which the patient states was not helpful. Patient is tearful and crying. No history of recent trauma.   Pain Inventory Average Pain 8 Pain Right Now 9 My pain is constant, sharp, burning, tingling, aching and grinding  In the last 24 hours, has pain interfered with the following? General activity 10 Relation with others 10 Enjoyment of life 10 What TIME of day is your pain at its worst? night Sleep (in general) Poor  Pain is worse with: walking and standing Pain improves with: nothing Relief from Meds: 0  Mobility walk without assistance ability to climb steps?  no do you drive?  yes  Function not employed: date last employed .  Neuro/Psych trouble walking spasms  Prior Studies Any changes since last visit?  no  Physicians involved in your care Any changes since last visit?  no   History reviewed. No pertinent family history. Social History   Social History  . Marital Status: Married    Spouse Name: N/A  . Number of Children: N/A  . Years of Education: N/A   Social History Main Topics  . Smoking status: Current Every Day Smoker -- 1.00 packs/day    Types: Cigarettes  . Smokeless tobacco: None  . Alcohol Use: No  . Drug Use: No  . Sexual Activity: Not Asked   Other Topics Concern  . None   Social History Narrative   Past Surgical History  Procedure Laterality Date  . Cesarean section    . Salpingectomy      LEFT SIDE   Past Medical History  Diagnosis Date  . Anxiety   . Ectopic pregnancy   . Chronic UTI (urinary tract infection)    BP 124/81 mmHg  Pulse 86  Resp 14  SpO2 100%  Opioid Risk Score:     Fall Risk Score:  `1  Depression screen PHQ 2/9  Depression screen PHQ 2/9 08/12/2015  Decreased Interest 1  Down, Depressed, Hopeless 2  PHQ - 2 Score 3  Altered sleeping 3  Tired, decreased energy 3  Change in appetite 3  Feeling bad or failure about yourself  2  Trouble concentrating 3  Moving slowly or fidgety/restless 1  Suicidal thoughts 1  PHQ-9 Score 19  Difficult doing work/chores Extremely dIfficult      Review of Systems  All other systems reviewed and are negative.      Objective:   Physical Exam  Constitutional: She is oriented to person, place, and time. She appears well-developed and well-nourished.  HENT:  Head: Normocephalic and atraumatic.  Eyes: Conjunctivae and EOM are normal. Pupils are equal, round, and reactive to light.  Musculoskeletal:  Tenderness to palpation in the PSIS area as well as the greater trochanteric area bilaterally. Negative straight leg raising Full hip internal rotation full hip flexionBilateral  Diminished right hip external rotation.   Neurological: She is alert and oriented to person, place, and time. She has normal strength.  Reflex Scores:      Patellar reflexes are 2+ on the right side and 2+ on the left side. Motor strength is 5/5 hip flexor and extensor ankle dorsiflexor. Pinprick sensation reduced  L4 L5 S1  dermatomal distribution.  Psychiatric: She has a normal mood and affect.  Nursing note and vitals reviewed.         Assessment & Plan:   1. Chronic hip pain there is tenderness bilaterally.  There is some decreased external rotation of the right hip. Will need imaging studies we'll check both hip as well as low back. This can be done at any [facility] affiliated facility.  We'll follow-up in 2 weeks to look at films No need for MRI at the current time, no radicular type issues other than some vague sensation deficits in the right lower extremity. Have given the patient some back exercises. She has  Medicaid so she only gets 1 physical therapy visit.  2. Depression, question bipolar disorder. She is on Risperdal,, Cymbalta and Wellbutrin.  She will follow up with psychiatry. Certainly if this is not adequately controlled this may lead to somatic symptoms.  Over half of the 25 min visit was spent counseling and coordinating care.We spent time reviewing her symptoms as well as her care thus far. We want over her recent ER visit. I explained that I don't think anything very serious is going on I do not recommend anything stronger than muscle relaxers unless we have some objective evidence of some type of significant pathology in the spine or hip girdle region

## 2015-08-29 NOTE — Patient Instructions (Signed)

## 2015-09-09 ENCOUNTER — Ambulatory Visit: Payer: Medicaid Other

## 2015-09-09 ENCOUNTER — Ambulatory Visit: Payer: Medicaid Other | Admitting: Physical Medicine & Rehabilitation

## 2015-09-12 ENCOUNTER — Ambulatory Visit (HOSPITAL_BASED_OUTPATIENT_CLINIC_OR_DEPARTMENT_OTHER): Payer: Medicaid Other | Admitting: Physical Medicine & Rehabilitation

## 2015-09-12 ENCOUNTER — Encounter: Payer: Self-pay | Admitting: Physical Medicine & Rehabilitation

## 2015-09-12 VITALS — BP 119/65 | HR 98

## 2015-09-12 DIAGNOSIS — M24551 Contracture, right hip: Secondary | ICD-10-CM | POA: Insufficient documentation

## 2015-09-12 DIAGNOSIS — M5431 Sciatica, right side: Secondary | ICD-10-CM

## 2015-09-12 DIAGNOSIS — G894 Chronic pain syndrome: Secondary | ICD-10-CM | POA: Diagnosis not present

## 2015-09-12 NOTE — Patient Instructions (Signed)
We'll see you back in about 2 weeks  May try ice to her low back

## 2015-09-12 NOTE — Progress Notes (Signed)
Subjective:    Patient ID: Natalie Barrera, female    DOB: 04/05/1976, 40 y.o.   MRN: [id number]  HPI 40 year old female with several month history of low back pain as well as right thigh pain. Pain in the thighs and anterior area both proximal and mid thigh. X-rays were performed on 08/29/2015 , right hip was normal. Low back showed some narrowing of the L4-L5 disc space. This was compared to prior films including CT scans of the seat abdomen and pelvis performed. Patient has occasional pain that goes into her right knee. Her has been no knee swelling. Patient had a fall possibly one week ago. He fell while walking down steps about 4 steps. Patient slipped and slid down on her back. No bowel or bladder issues no progressive weakness in the leg. No numbness noted by the patient Pain Inventory Average Pain 8 Pain Right Now 8 My pain is other  In the last 24 hours, has pain interfered with the following? General activity 10 Relation with others 10 Enjoyment of life 10 What TIME of day is your pain at its worst? morning and night Sleep (in general) Poor  Pain is worse with: bending Pain improves with: na Relief from Meds: 0  Mobility walk without assistance  Function not employed: date last employed .  Neuro/Psych depression anxiety  Prior Studies Any changes since last visit?  no  Physicians involved in your care Any changes since last visit?  no   History reviewed. No pertinent family history. Social History   Social History  . Marital Status: Married    Spouse Name: N/A  . Number of Children: N/A  . Years of Education: N/A   Social History Main Topics  . Smoking status: Current Every Day Smoker -- 1.00 packs/day    Types: Cigarettes  . Smokeless tobacco: None  . Alcohol Use: No  . Drug Use: No  . Sexual Activity: Not Asked   Other Topics Concern  . None   Social History Narrative   Past Surgical History  Procedure Laterality Date  . Cesarean  section    . Salpingectomy      LEFT SIDE   Past Medical History  Diagnosis Date  . Anxiety   . Ectopic pregnancy   . Chronic UTI (urinary tract infection)    BP 119/65 mmHg  Pulse 98  SpO2 97%  Opioid Risk Score:   Fall Risk Score:  `1  Depression screen PHQ 2/9  Depression screen [facility] 2/9 09/12/2015 08/12/2015  Decreased Interest 1 1  Down, Depressed, Hopeless 2 2  PHQ - 2 Score 3 3  Altered sleeping - 3  Tired, decreased energy - 3  Change in appetite - 3  Feeling bad or failure about yourself  - 2  Trouble concentrating - 3  Moving slowly or fidgety/restless - 1  Suicidal thoughts - 1  PHQ-9 Score - 19  Difficult doing work/chores - Extremely dIfficult     Review of Systems  Constitutional: Positive for appetite change and unexpected weight change.  All other systems reviewed and are negative.      Objective:   Physical Exam  Constitutional: She is oriented to person, place, and time. She appears well-developed and well-nourished.  HENT:  Head: Normocephalic and atraumatic.  Eyes: Conjunctivae and EOM are normal. Pupils are equal, round, and reactive to light.  Neck: Normal range of motion.  Musculoskeletal:       Lumbar back: She exhibits decreased range of motion, tenderness  and pain.  Lumbar spine has tenderness palpation lumbosacral junction. There is no pain with lumbar extension and good range. There is pain with forward flexion and ranges around 75% of normal.  Neurological: She is alert and oriented to person, place, and time. She has normal strength. No sensory deficit. Gait normal.  Reflex Scores:      Patellar reflexes are 1+ on the right side and 1+ on the left side.      Achilles reflexes are 1+ on the right side and 1+ on the left side. Motor strength is 5/5 in the hip flexors knee extensors and ankle dorsiflexors. She does have straight leg raise test on the right but it refers to the low back rather than the posterior thigh and leg.  Gait is  normal, no evidence of toe drag or knee instability. No difficulties with toe walk or heel walk.  Psychiatric: She has a normal mood and affect.  Nursing note and vitals reviewed.  Right hip has decreased internal and external rotation compared to the left side. No tenderness over the greater trochanter at the current time. Patient with decreased sensation to pinprick in the right L5 and right S1 distribution.      Assessment & Plan:  1.  Lumbar pain radiating into the right thigh. While there is decreased range of motion in the right hip x-rays look normal.  Suspect patient may have lumbar disc causing radicular symptoms in the right lower extremity. Neuro exam points to right L5 and right S1 distribution however symptoms point to L2 or L3. Recommend MRI to further evaluate. Patient has been under my care for greater than 1 month. She's had symptoms for several months Will return to clinic in 2 weeks. Recommended stay active as possible which she is doing. She is using some muscle relaxers for pain.

## 2015-09-16 ENCOUNTER — Inpatient Hospital Stay (HOSPITAL_COMMUNITY)
Admission: AD | Admit: 2015-09-16 | Discharge: 2015-09-19 | DRG: 885 | Disposition: A | Discharge status: Home / Self Care | Payer: Medicaid Other | Source: Intra-hospital | Attending: Psychiatry | Admitting: Psychiatry

## 2015-09-16 ENCOUNTER — Encounter (HOSPITAL_COMMUNITY): Payer: Self-pay | Admitting: *Deleted

## 2015-09-16 ENCOUNTER — Encounter (HOSPITAL_COMMUNITY): Payer: Self-pay

## 2015-09-16 ENCOUNTER — Emergency Department (HOSPITAL_COMMUNITY)
Admission: EM | Admit: 2015-09-16 | Discharge: 2015-09-16 | Disposition: A | Discharge status: Other Acute Inpatient Hospital | Payer: Medicaid Other | Attending: Emergency Medicine | Admitting: Emergency Medicine

## 2015-09-16 DIAGNOSIS — Y998 Other external cause status: Secondary | ICD-10-CM | POA: Insufficient documentation

## 2015-09-16 DIAGNOSIS — Z87440 Personal history of urinary (tract) infections: Secondary | ICD-10-CM | POA: Diagnosis not present

## 2015-09-16 DIAGNOSIS — Z331 Pregnant state, incidental: Secondary | ICD-10-CM | POA: Diagnosis not present

## 2015-09-16 DIAGNOSIS — Y9389 Activity, other specified: Secondary | ICD-10-CM | POA: Diagnosis not present

## 2015-09-16 DIAGNOSIS — Z79899 Other long term (current) drug therapy: Secondary | ICD-10-CM | POA: Diagnosis not present

## 2015-09-16 DIAGNOSIS — F17210 Nicotine dependence, cigarettes, uncomplicated: Secondary | ICD-10-CM | POA: Insufficient documentation

## 2015-09-16 DIAGNOSIS — Y9289 Other specified places as the place of occurrence of the external cause: Secondary | ICD-10-CM | POA: Insufficient documentation

## 2015-09-16 DIAGNOSIS — Z915 Personal history of self-harm: Secondary | ICD-10-CM | POA: Diagnosis not present

## 2015-09-16 DIAGNOSIS — F1310 Sedative, hypnotic or anxiolytic abuse, uncomplicated: Secondary | ICD-10-CM | POA: Diagnosis not present

## 2015-09-16 DIAGNOSIS — T426X2A Poisoning by other antiepileptic and sedative-hypnotic drugs, intentional self-harm, initial encounter: Secondary | ICD-10-CM | POA: Diagnosis not present

## 2015-09-16 DIAGNOSIS — F419 Anxiety disorder, unspecified: Secondary | ICD-10-CM | POA: Diagnosis not present

## 2015-09-16 DIAGNOSIS — T391X2A Poisoning by 4-Aminophenol derivatives, intentional self-harm, initial encounter: Secondary | ICD-10-CM | POA: Insufficient documentation

## 2015-09-16 DIAGNOSIS — G4700 Insomnia, unspecified: Secondary | ICD-10-CM | POA: Diagnosis present

## 2015-09-16 DIAGNOSIS — R45851 Suicidal ideations: Secondary | ICD-10-CM | POA: Diagnosis not present

## 2015-09-16 DIAGNOSIS — T424X2A Poisoning by benzodiazepines, intentional self-harm, initial encounter: Secondary | ICD-10-CM | POA: Insufficient documentation

## 2015-09-16 DIAGNOSIS — F332 Major depressive disorder, recurrent severe without psychotic features: Secondary | ICD-10-CM | POA: Diagnosis not present

## 2015-09-16 DIAGNOSIS — F329 Major depressive disorder, single episode, unspecified: Secondary | ICD-10-CM

## 2015-09-16 DIAGNOSIS — F32A Depression, unspecified: Secondary | ICD-10-CM

## 2015-09-16 HISTORY — DX: Major depressive disorder, single episode, unspecified: F32.9

## 2015-09-16 HISTORY — DX: Depression, unspecified: F32.A

## 2015-09-16 LAB — ACETAMINOPHEN LEVEL

## 2015-09-16 LAB — CBC
HEMATOCRIT: 43.6 % (ref 36.0–46.0)
HEMOGLOBIN: 14.2 g/dL (ref 12.0–15.0)
MCH: 30.2 pg (ref 26.0–34.0)
MCHC: 32.6 g/dL (ref 30.0–36.0)
MCV: 92.8 fL (ref 78.0–100.0)
Platelets: 303 10*3/uL (ref 150–400)
RBC: 4.7 MIL/uL (ref 3.87–5.11)
RDW: 15.3 % (ref 11.5–15.5)
WBC: 6.6 10*3/uL (ref 4.0–10.5)

## 2015-09-16 LAB — COMPREHENSIVE METABOLIC PANEL
ALK PHOS: 72 U/L (ref 38–126)
ALT: 16 U/L (ref 14–54)
ANION GAP: 13 (ref 5–15)
AST: 17 U/L (ref 15–41)
Albumin: 4.5 g/dL (ref 3.5–5.0)
BILIRUBIN TOTAL: 0.6 mg/dL (ref 0.3–1.2)
BUN: 11 mg/dL (ref 6–20)
CALCIUM: 8.9 mg/dL (ref 8.9–10.3)
CO2: 18 mmol/L — ABNORMAL LOW (ref 22–32)
CREATININE: 0.66 mg/dL (ref 0.44–1.00)
Chloride: 107 mmol/L (ref 101–111)
GFR calc non Af Amer: >60 mL/min (ref >60)
GLUCOSE: 95 mg/dL (ref 65–99)
Potassium: 3.7 mmol/L (ref 3.5–5.1)
Sodium: 138 mmol/L (ref 135–145)
TOTAL PROTEIN: 7.2 g/dL (ref 6.5–8.1)

## 2015-09-16 LAB — I-STAT BETA HCG BLOOD, ED (MC, WL, AP ONLY): I-stat hCG, quantitative: 18.3 m[IU]/mL — ABNORMAL HIGH (ref <5)

## 2015-09-16 LAB — RAPID URINE DRUG SCREEN, HOSP PERFORMED
Amphetamines: NOT DETECTED
BARBITURATES: NOT DETECTED
Benzodiazepines: POSITIVE — AB
COCAINE: NOT DETECTED
Opiates: NOT DETECTED
TETRAHYDROCANNABINOL: NOT DETECTED

## 2015-09-16 LAB — ETHANOL: Alcohol, Ethyl (B): <5 mg/dL (ref <5)

## 2015-09-16 LAB — SALICYLATE LEVEL

## 2015-09-16 MED ORDER — ALUM & MAG HYDROXIDE-SIMETH 200-200-20 MG/5ML PO SUSP
30.0000 mL | ORAL | Status: DC | PRN
  Administered 2015-09-17: 30 mL via ORAL
  Filled 2015-09-16: qty 30

## 2015-09-16 MED ORDER — ZOLPIDEM TARTRATE 5 MG PO TABS: 5.0000 mg | ORAL_TABLET | Freq: Every evening | ORAL | Status: DC | PRN

## 2015-09-16 MED ORDER — INFLUENZA VAC SPLIT QUAD 0.5 ML IM SUSY
0.5000 mL | PREFILLED_SYRINGE | INTRAMUSCULAR | Status: DC
  Filled 2015-09-16: qty 0.5

## 2015-09-16 MED ORDER — ACETAMINOPHEN 325 MG PO TABS
650.0000 mg | ORAL_TABLET | Freq: Four times a day (QID) | ORAL | Status: DC | PRN
  Administered 2015-09-18 – 2015-09-19 (×2): 650 mg via ORAL
  Filled 2015-09-16 (×2): qty 2

## 2015-09-16 MED ORDER — HYDROXYZINE HCL 25 MG PO TABS: 25.0000 mg | ORAL_TABLET | Freq: Four times a day (QID) | ORAL | Status: DC | PRN

## 2015-09-16 MED ORDER — LORAZEPAM 0.5 MG PO TABS: 0.5000 mg | ORAL_TABLET | Freq: Four times a day (QID) | ORAL | Status: DC | PRN

## 2015-09-16 MED ORDER — MAGNESIUM HYDROXIDE 400 MG/5ML PO SUSP: 30.0000 mL | Freq: Every day | ORAL | Status: DC | PRN

## 2015-09-16 MED ORDER — TRAZODONE HCL 50 MG PO TABS
50.0000 mg | ORAL_TABLET | Freq: Every evening | ORAL | Status: DC | PRN
  Administered 2015-09-16: 50 mg via ORAL
  Filled 2015-09-16 (×4): qty 1

## 2015-09-16 MED ORDER — HYDROXYZINE HCL 25 MG PO TABS
25.0000 mg | ORAL_TABLET | Freq: Four times a day (QID) | ORAL | Status: DC | PRN
  Administered 2015-09-16: 25 mg via ORAL
  Filled 2015-09-16: qty 1

## 2015-09-16 MED ORDER — PNEUMOCOCCAL VAC POLYVALENT 25 MCG/0.5ML IJ INJ: 0.5000 mL | INJECTION | INTRAMUSCULAR | Status: DC

## 2015-09-16 NOTE — ED Notes (Signed)
Pt oriented to room and unit.  She is very tearful and depressed.  Pt. Contracts for safety.  15 minute checks and video monitoring continue.

## 2015-09-16 NOTE — BH Assessment (Addendum)
Tele Assessment Note   Natalie Barrera is an 40 y.o. female. Pt presented to [facility] and then was directed to [facility] as she had taken an intentional overdose on Tylenol last night. Pt is cooperative and oriented x 4. She reports intentional overdose last night was a suicide attempt. She reports a previous suicide attempt in 2006 by intentional overdose Per chart review, pt was admitted to [facility] in 2006 after her suicide attempt. She states her brother committed suicide in 2006. When asked re: current stressors, pt replies, "Everything about life." She says she lives with her husband and two children (ages 47 and 5). Pt reports she goes to [facility] for counseling and med management. Pt reports depressed mood. She endorses insomnia (3 hours nightly), fatigue, poor appetite, tearfulness and hopelessness. Pt reports no social support system. She reports she went to [place] but [facility] [employer]. She denies HI and denies [facility]. Pt says, "I feel like when I walk in a room, everyone is talking about me." Pt endorses severe anxiety with panic attacks three times weekly. She reports most recent panic attack was yesterday. Pt's UDS is + for benzos as pt is prescribed them for her severe anxiety by [facility].   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 Anxiety   . Ectopic pregnancy   . Chronic UTI (urinary tract infection)   . Depression     Past Surgical History  Procedure Laterality Date  . Cesarean section    . Salpingectomy      LEFT SIDE    Family History:  Family History  Problem Relation Age of Onset  . Heart failure Father     Social History:  reports that she has been smoking Cigarettes.  She has been smoking about 1.00 pack per day. She has never used smokeless tobacco. She reports that she does not drink alcohol or use illicit  drugs.  Additional Social History:  Alcohol / Drug Use Pain Medications: pt denies abuse - see PTA meds list Prescriptions: pt denies abuse - see PTA meds list Over the Counter: pt denies abuse - see PTA meds list History of alcohol / drug use?: No history of alcohol / drug abuse  CIWA: CIWA-Ar BP: 143/90 mmHg Pulse Rate: 91 COWS:    PATIENT STRENGTHS: (choose at least two) Ability for insight Average or above average intelligence Communication skills General fund of knowledge Physical Health  Allergies:  Allergies  Allergen Reactions  . Keflex [Cephalexin] Hives  . Sulfa Antibiotics Nausea And Vomiting    Home Medications:  (Not in a hospital admission)  OB/GYN Status:  Patient's last menstrual period was 08/30/2015.  General Assessment Data Location of Assessment: [facility] TTS Assessment: In system Is this a Tele or Face-to-Face Assessment?: Tele Assessment Is this an Initial Assessment or a Re-assessment for this encounter?: Initial Assessment Marital status: Married Is patient pregnant?: No Pregnancy Status: No Living Arrangements: Spouse/significant other, Children (husband, kids (3 & 5 yo)) Can pt return to current living arrangement?: Yes Admission Status: Voluntary Is patient capable of signing voluntary admission?: Yes Referral Source: Self/Family/Friend Insurance type: medicaid     Crisis Care Plan Living Arrangements: Spouse/significant other, Children (husband, kids (3 & 5 yo)) Name of Psychiatrist: at [facility] Name of Therapist: at [facility] Status Is patient currently in school?: No Highest grade of school patient has completed:  14 Name of school: [employer]  Risk to self with the past 6 months Suicidal Ideation: Yes-Currently Present Has patient been a risk to self within the past 6 months prior to admission? : Yes Suicidal Intent: Yes-Currently Present Has patient had any suicidal intent within the past 6 months prior to  admission? : Yes Is patient at risk for suicide?: Yes Suicidal Plan?: No Has patient had any suicidal plan within the past 6 months prior to admission? : Yes Access to Means: Yes Specify Access to Suicidal Means: access to meds What has been your use of drugs/alcohol within the last 12 months?: none Previous Attempts/Gestures: Yes How many times?: 1 (overdose in 2006) Other Self Harm Risks: none Intentional Self Injurious Behavior: None Family Suicide History: Yes (brother committed suicide in 2006) Recent stressful life event(s): Other (Comment) ("everything about life") Persecutory voices/beliefs?: No Depression: Yes Depression Symptoms: Fatigue, Loss of interest in usual pleasures, Despondent, Tearfulness, Insomnia (poor appetite) Substance abuse history and/or treatment for substance abuse?: No Suicide prevention information given to non-admitted patients: Not applicable  Risk to Others within the past 6 months Homicidal Ideation: No Does patient have any lifetime risk of violence toward others beyond the six months prior to admission? : No Thoughts of Harm to Others: No Current Homicidal Intent: No Current Homicidal Plan: No Access to Homicidal Means: No Identified Victim: none History of harm to others?: No Assessment of Violence: None Noted Violent Behavior Description: pt denies hx violence Does patient have access to weapons?: No Criminal Charges Pending?: No Does patient have a court date: No Is patient on probation?: No  Psychosis Hallucinations: None noted Delusions: None noted  Mental Status Report Appearance/Hygiene: Unremarkable, In scrubs Eye Contact: Fair Motor Activity: Freedom of movement Speech: Logical/coherent, Soft Level of Consciousness: Quiet/awake, Alert Mood: Depressed, Sad, Anhedonia Affect: Appropriate to circumstance, Depressed, Sad Anxiety Level: Minimal Thought Processes: Relevant, Coherent Judgement: Unimpaired Orientation: Person,  Place, Time, Situation Obsessive Compulsive Thoughts/Behaviors: None  Cognitive Functioning Concentration: Normal Memory: Remote Intact, Recent Intact IQ: Average Insight: Good Impulse Control: Poor Appetite: Poor Weight Loss:  (pt denies weight loss) Sleep: Decreased Total Hours of Sleep: 3 Vegetative Symptoms: None  ADLScreening [facility] Assessment Services) Patient's cognitive ability adequate to safely complete daily activities?: Yes Patient able to express need for assistance with ADLs?: Yes Independently performs ADLs?: Yes (appropriate for developmental age)  Prior Inpatient Therapy Prior Inpatient Therapy: Yes Prior Therapy Dates: 2006 Prior Therapy Facilty/Provider(s): [facility] Reason for Treatment: intentional overdose  Prior Outpatient Therapy Prior Outpatient Therapy: Yes Prior Therapy Dates: currently Prior Therapy Facilty/Provider(s): [facility] Reason for Treatment: med management and counseling Does patient have an ACCT team?: No Does patient have Intensive In-House Services?  : No Does patient have [facility] services? : No Does patient have P4CC services?: Unknown  ADL Screening (condition at time of admission) Patient's cognitive ability adequate to safely complete daily activities?: Yes Is the patient deaf or have difficulty hearing?: No Does the patient have difficulty seeing, even when wearing glasses/contacts?: No Does the patient have difficulty concentrating, remembering, or making decisions?: No Patient able to express need for assistance with ADLs?: Yes Does the patient have difficulty dressing or bathing?: No Independently performs ADLs?: Yes (appropriate for developmental age) Does the patient have difficulty walking or climbing stairs?: No Weakness of Legs: None Weakness of Arms/Hands: None  Home Assistive Devices/Equipment Home Assistive Devices/Equipment: None    Abuse/Neglect Assessment (Assessment to be complete while patient is  alone) Physical Abuse: Denies  Verbal Abuse: Denies Sexual Abuse: Denies Exploitation of patient/patient's resources: Denies Self-Neglect: Denies     Merchant [employer] officer (For Healthcare) Does patient have an advance directive?: No Would patient like information on creating an advanced directive?: No - patient declined information    Additional Information 1:1 In Past 12 Months?: No CIRT Risk: No Elopement Risk: No Does patient have medical clearance?: Yes     Disposition:  Disposition Initial Assessment Completed for this Encounter: Yes Disposition of Patient: Inpatient treatment program Type of inpatient treatment program: Adult Catha Nottingham lord DNP accepted to [facility] after 9 pm)  Leocadio Heal P 09/16/2015 6:06 PM

## 2015-09-16 NOTE — ED Notes (Signed)
Nurs Dischg Note:  D:pt D/C to [facility]. Pt in no distress at this time. Pt Passive -SI- contracts for safety.. Pt appears calm and cooperative, and no distress noted.  A: All Personal items in locker given to [employer] driver. Pt escorted out of the building by [facility].  R:  Pt States she will comply with inpatient services.

## 2015-09-16 NOTE — Tx Team (Signed)
Initial Interdisciplinary Treatment Plan   PATIENT STRESSORS: Marital or family conflict   PATIENT STRENGTHS: Ability for insight Average or above average intelligence Capable of independent living Communication skills General fund of knowledge Motivation for treatment/growth Physical Health   PROBLEM LIST: Problem List/Patient Goals Date to be addressed Date deferred Reason deferred Estimated date of resolution  "Help me get help with my depression" 09/16/15     "Get my mind back on track" 09/16/15           Depression 09/16/15     Suicidal Ideation 09/16/15                              DISCHARGE CRITERIA:  Ability to meet basic life and health needs Adequate post-discharge living arrangements Improved stabilization in mood, thinking, and/or behavior Medical problems require only outpatient monitoring Motivation to continue treatment in a less acute level of care Need for constant or close observation no longer present Reduction of life-threatening or endangering symptoms to within safe limits Safe-care adequate arrangements made Verbal commitment to aftercare and medication compliance  PRELIMINARY DISCHARGE PLAN: Outpatient therapy Participate in family therapy Return to previous living arrangement  PATIENT/FAMIILY INVOLVEMENT: This treatment plan has been presented to and reviewed with the patient, DHANVI BOESEN, and/or family member.  The patient and family have been given the opportunity to ask questions and make suggestions.  Fransico Michael [facility] 09/16/2015, 9:45 PM

## 2015-09-16 NOTE — ED Provider Notes (Signed)
CSN: [id number]     Arrival date & time 09/16/15  1102 History   First MD Initiated Contact with Patient 09/16/15 1308     Chief Complaint  Patient presents with  . Drug Overdose  . Suicidal     (Consider location/radiation/quality/duration/timing/severity/associated sxs/prior Treatment) HPI.Marland KitchenMarland KitchenMarland KitchenPatient took multiple tablets of Tylenol PM last evening at 9:30 PM along with Xanax and Lunesta. She has been depressed and suicidal lately. She lives at home with her husband and 2 children. She does not take street drugs. Prior suicide attempt in 2006. She is not psychotic. No alcohol or street drugs.  Past Medical History  Diagnosis Date  . Anxiety   . Ectopic pregnancy   . Chronic UTI (urinary tract infection)   . Depression    Past Surgical History  Procedure Laterality Date  . Cesarean section    . Salpingectomy      LEFT SIDE   Family History  Problem Relation Age of Onset  . Heart failure Father    Social History  Substance Use Topics  . Smoking status: Current Every Day Smoker -- 1.00 packs/day    Types: Cigarettes  . Smokeless tobacco: Never Used  . Alcohol Use: No   OB History    No data available     Review of Systems  All other systems reviewed and are negative.     Allergies  Keflex and Sulfa antibiotics  Home Medications   Prior to Admission medications   Medication Sig Start Date End Date Taking? Authorizing Provider  ALPRAZolam Prudy Feeler) 0.5 MG tablet Take 1.5 mg by mouth 3 (three) times daily as needed for anxiety.   Yes Historical Provider, MD  diphenhydramine-acetaminophen (TYLENOL PM) 25-500 MG TABS tablet Take 4 tablets by mouth 3 (three) times daily as needed (for pain).   Yes Historical Provider, MD  eszopiclone (LUNESTA) 1 MG TABS tablet Take 1 mg by mouth at bedtime as needed for sleep. Take immediately before bedtime   Yes Historical Provider, MD  cyclobenzaprine (FLEXERIL) 5 MG tablet Take 1 tablet (5 mg total) by mouth 3 (three) times daily  as needed for muscle spasms. Patient not taking: Reported on 09/16/2015 08/12/15   Erick Colace, MD  tizanidine (ZANAFLEX) 2 MG capsule Take 1 capsule (2 mg total) by mouth 3 (three) times daily. Patient not taking: Reported on 09/16/2015 08/29/15   Erick Colace, MD   BP 143/90 mmHg  Pulse 91  Temp(Src) 98.5 F (36.9 C) (Oral)  Resp 16  Ht  (1.6 m)  Wt 173 lb (78.472 kg)  BMI 30.65 kg/m2  SpO2 98%  LMP 08/30/2015 Physical Exam  Constitutional: She is oriented to person, place, and time. She appears well-developed and well-nourished.  HENT:  Head: Normocephalic and atraumatic.  Eyes: Conjunctivae and EOM are normal. Pupils are equal, round, and reactive to light.  Neck: Normal range of motion. Neck supple.  Cardiovascular: Normal rate and regular rhythm.   Pulmonary/Chest: Effort normal and breath sounds normal.  Abdominal: Soft. Bowel sounds are normal.  Musculoskeletal: Normal range of motion.  Neurological: She is alert and oriented to person, place, and time.  Skin: Skin is warm and dry.  Psychiatric:  Flat affect, depressed  Nursing note and vitals reviewed.   ED Course  Procedures (including critical care time) Labs Review Labs Reviewed  COMPREHENSIVE METABOLIC PANEL - Abnormal; Notable for the following:    CO2 18 (*)    All other components within normal limits  ACETAMINOPHEN LEVEL -  Abnormal; Notable for the following:    Acetaminophen (Tylenol), Serum <10 (*)    All other components within normal limits  URINE RAPID DRUG SCREEN, HOSP PERFORMED - Abnormal; Notable for the following:    Benzodiazepines POSITIVE (*)    All other components within normal limits  I-STAT BETA HCG BLOOD, ED ([facility], [facility], [facility] ONLY) - Abnormal; Notable for the following:    I-stat hCG, quantitative 18.3 (*)    All other components within normal limits  ETHANOL  SALICYLATE LEVEL  CBC    Imaging Review No results found. I have personally reviewed and evaluated these images  and lab results as part of my medical decision-making.   EKG Interpretation None      MDM   Final diagnoses:  Depression   Patient is alert and oriented. She is not psychotic. She is depressed with suicidal ideation. Will obtain behavioral consult    Donnetta Hutching, MD 09/16/15 1410

## 2015-09-16 NOTE — ED Notes (Signed)
Bed: [facility] Expected date:  Expected time:  Means of arrival:  Comments: TR 2 @ 1:30

## 2015-09-16 NOTE — ED Notes (Signed)
Patient drove herself to [facility]. BH brought patient to the ED because patient reported that she had taken a handful of Tylenol PM before midnight along with 1 Lunesta and 1 Xanax 0.5 mg. Patient states she was trying to kill herself. Patient had a prior attempt in 2006 and stated she took an overdose. Patient denies HI, visual or auditory hallucinations. Patient is very tearful and sad. Patient states she was at her sister's house and just woke up this AM at 0500 and walked back home. Patient denies drinking any alcohol.

## 2015-09-17 DIAGNOSIS — F332 Major depressive disorder, recurrent severe without psychotic features: Principal | ICD-10-CM

## 2015-09-17 LAB — HCG, QUANTITATIVE, PREGNANCY

## 2015-09-17 LAB — TSH: TSH: 1.179 u[IU]/mL (ref 0.350–4.500)

## 2015-09-17 MED ORDER — DIPHENHYDRAMINE HCL 25 MG PO CAPS
25.0000 mg | ORAL_CAPSULE | Freq: Every evening | ORAL | Status: DC | PRN
  Administered 2015-09-17: 25 mg via ORAL
  Filled 2015-09-17: qty 1

## 2015-09-17 MED ORDER — CLONAZEPAM 0.5 MG PO TABS
0.5000 mg | ORAL_TABLET | Freq: Two times a day (BID) | ORAL | Status: DC
  Administered 2015-09-17 – 2015-09-19 (×4): 0.5 mg via ORAL
  Filled 2015-09-17 (×4): qty 1

## 2015-09-17 MED ORDER — FLUOXETINE HCL 20 MG PO CAPS
20.0000 mg | ORAL_CAPSULE | Freq: Every day | ORAL | Status: DC
  Administered 2015-09-17 – 2015-09-19 (×3): 20 mg via ORAL
  Filled 2015-09-17 (×5): qty 1

## 2015-09-17 MED ORDER — DIPHENHYDRAMINE HCL 50 MG PO CAPS
50.0000 mg | ORAL_CAPSULE | Freq: Every evening | ORAL | Status: DC | PRN
  Administered 2015-09-17: 50 mg via ORAL
  Filled 2015-09-17: qty 1
  Filled 2015-09-17: qty 2
  Filled 2015-09-17 (×3): qty 1

## 2015-09-17 MED ORDER — NICOTINE 21 MG/24HR TD PT24
21.0000 mg | MEDICATED_PATCH | Freq: Every day | TRANSDERMAL | Status: DC
  Administered 2015-09-17 – 2015-09-19 (×3): 21 mg via TRANSDERMAL
  Filled 2015-09-17 (×4): qty 1

## 2015-09-17 NOTE — [facility] Group Notes (Signed)
[facility] LCSW Aftercare Discharge Planning Group Note  09/17/2015  8:45 AM  Participation Quality: Did Not Attend. Patient invited to participate but declined.  Lavana Huckeba, MSW, LCSW Clinical Social Worker [facility] [phone]   

## 2015-09-17 NOTE — Progress Notes (Signed)
  D: Pt was pleasant and cooperative during the adm process. However, during the adm process the pt became tearful. Writer attempted to assure pt that she would be taken care of at [facility]. When asked the circumstances surrounding her adm pt stated that she and her husband "just separated".  Pt was still positive for SI during the adm process. Denies all others. Pt has no questions or concerns.

## 2015-09-17 NOTE — Progress Notes (Signed)
Pt did not attend wrap up group this evening.  

## 2015-09-17 NOTE — Progress Notes (Signed)
Patient ID: Natalie Barrera, female   DOB: 1975-11-27, 40 y.o.   MRN: [id number]  Pt currently presents with a flat affect and anxious behavior. Pt dress is disheveled, poor personal hygiene.  Per self inventory, pt rates depression, hopelessness and anxiety at a 10. Pt reports "I'm ready to go home." Pt reports poor sleep, a poor appetite, low energy and poor concentration. Pt reports pain in her stomach and back. Pt has trending hypotension.   Pt provided with medications per providers orders. Pt's labs and vitals were monitored throughout the day. Pt supported emotionally and encouraged to express concerns and questions. Pt educated on medications. Fluids and nutritional intake encouraged.   Pt's safety ensured with 15 minute and environmental checks. Pt currently denies SI/HI and A/V hallucinations. Pt verbally agrees to seek staff if SI/HI or A/VH occurs and to consult with staff before acting on these thoughts. Will continue POC.

## 2015-09-17 NOTE — Progress Notes (Signed)
Recreation Therapy Notes  Date: 03.01.2017 Time: 9:30am Location: [address] Group Room   Group Topic: Stress Management  Goal Area(s) Addresses:  Patient will actively participate in stress management techniques presented during session.   Behavioral Response: Did not attend.   Marykay Lex Kiara Mcdowell, LRT/CTRS        Cindia Hustead L 09/17/2015 11:56 AM
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
## 2015-09-17 NOTE — Tx Team (Addendum)
Interdisciplinary Treatment Plan Update (Adult) Date: 09/17/2015    Time Reviewed: 9:30 AM  Progress in Treatment: Attending groups: Yes Participating in groups:Yes Taking medication as prescribed: Yes Tolerating medication: Yes Family/Significant other contact made: No, patient declined for collateral contact Patient understands diagnosis: Yes Discussing patient identified problems/goals with staff: Yes Medical problems stabilized or resolved: Yes Denies suicidal/homicidal ideation: Yes Issues/concerns per patient self-inventory: Yes Other:  New problem(s) identified: N/A  Discharge Plan or Barriers: Patient will return home to follow up with outpatient services.   Reason for Continuation of Hospitalization:  Depression Anxiety Medication Stabilization   Comments: N/A  Estimated length of stay: Discharge anticipated for 09/19/15    Patient is a 40 year old female admitted to the hospital with panic attacks following an intentional overdose. Patient will benefit from crisis stabilization, medication evaluation, group therapy and psycho education in addition to case management for discharge planning. At discharge, it is recommended that Pt remain compliant with established discharge plan and continued treatment.   Review of initial/current patient goals per problem list:  1. Goal(s): Patient will participate in aftercare plan   Met: Yes   Target date: 3-5 days post admission date   As evidenced by: Patient will participate within aftercare plan AEB aftercare provider and housing plan at discharge being identified.   3/1: Goal met. Patient will return home to follow up with outpatient services.    2. Goal (s): Patient will exhibit decreased depressive symptoms and suicidal ideations.   Met: Yes   Target date: 3-5 days post admission date   As evidenced by: Patient will utilize self rating of depression at 3 or below and demonstrate decreased signs of  depression or be deemed stable for discharge by MD.  3/1: Goal not met: Pt presents with flat affect and depressed mood.  Pt admitted with depression rating of 10.  Pt to show decreased sign of depression and a rating of 3 or less before d/c.    3/3: Goal met. Patient rates depression at 3, denies SI.    3. Goal(s): Patient will demonstrate decreased signs and symptoms of anxiety.   Met: Adequate for discharge per MD   Target date: 3-5 days post admission date   As evidenced by: Patient will utilize self rating of anxiety at 3 or below and demonstrated decreased signs of anxiety, or be deemed stable for discharge by MD   3/1: Goal not met: Pt presents with anxious mood and affect.  Pt admitted with anxiety rating of 10.  Pt to show decreased sign of anxiety and a rating of 3 or less before d/c.  3/3: Adequate for discharge per MD. Patient reports mild anxiety related to discharging home today but reports feeling ready to go home.    Attendees: Patient:    Family:    Physician: Dr. Parke Poisson 09/17/2015 9:30 AM  Nursing: Janann August, Christain [place], RN 09/17/2015 9:30 AM  Clinical Social Worker: Tilden Fossa, LCSW 09/17/2015 9:30 AM  Other: 09/17/2015 9:30 AM  Other: Edwyna Shell, LCSW 09/17/2015 9:30 AM  Other: Lars Pinks, Case Manager 09/17/2015 9:30 AM  Other:  Agustina Caroli, May Augustin, NP 09/17/2015 9:30 AM  Other:    Other:    Other:    Other:     Scribe for Treatment Team:  Tilden Fossa, [place]

## 2015-09-17 NOTE — [facility] Group Notes (Signed)
[facility] LCSW Group Therapy 09/17/2015  1:15 pm   Type of Therapy: Group Therapy Participation Level: Active  Participation Quality: Attentive, Sharing and Supportive  Affect: anxious, tearful  Cognitive: Alert and Oriented  Insight: Developing/Improving and Engaged  Engagement in Therapy: Developing/Improving and Engaged  Modes of Intervention: Clarification, Confrontation, Discussion, Education, Exploration, Limit-setting, Orientation, Problem-solving, Rapport Building, Dance movement psychotherapist, Socialization and Support  Summary of Progress/Problems: The topic for group was balance in life. Today's group focused on defining balance in one's own words, identifying things that can knock one off balance, and exploring healthy ways to maintain balance in life. Group members were asked to provide an example of a time when they felt off balance, describe how they handled that situation,and process healthier ways to regain balance in the future. Group members were asked to share the most important tool for maintaining balance that they learned while at [facility] and how they plan to apply this method after discharge. Patient identified her children as balance in her life and marital issues as a way that her life is unbalanced. Patient states "my husband doesn't care about me". CSW and other group members provided emotional support and encouragement.  Samuella Bruin, MSW, LCSW Clinical Social Worker [facility] [phone]
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
## 2015-09-18 DIAGNOSIS — R45851 Suicidal ideations: Secondary | ICD-10-CM

## 2015-09-18 MED ORDER — TRAZODONE HCL 50 MG PO TABS
50.0000 mg | ORAL_TABLET | Freq: Once | ORAL | Status: AC
  Administered 2015-09-19: 50 mg via ORAL
  Filled 2015-09-18 (×2): qty 1

## 2015-09-18 MED ORDER — TRAZODONE HCL 50 MG PO TABS
50.0000 mg | ORAL_TABLET | Freq: Every evening | ORAL | Status: DC | PRN
  Administered 2015-09-18: 50 mg via ORAL
  Filled 2015-09-18: qty 1

## 2015-09-18 NOTE — [facility] Suicide Risk Assessment (Signed)
[facility] INPATIENT:  Family/Significant Other Suicide Prevention Education  Suicide Prevention Education:  Patient Refusal for Family/Significant Other Suicide Prevention Education: The patient Natalie Barrera has refused to provide written consent for family/significant other to be provided Family/Significant Other Suicide Prevention Education during admission and/or prior to discharge.  Physician notified. SPE reviewed with patient and brochure provided. Patient encouraged to return to hospital if having suicidal thoughts, patient verbalized his/her understanding and has no further questions at this time.   [place], [place] 09/18/2015, 9:01 AM

## 2015-09-18 NOTE — Progress Notes (Signed)
D: Natalie Barrera appeared dysphoric and tearful this a.m. She denied SI/HI/AVH and admitted back pain of 7. She signed a 72-hour request for discharge at [phone]. On her self-inventory sheet, she rated her depression 6, hopelessness 4, and anxiety a 10. Upon approach this a.m., she asked this writer when she could speak with the MD about getting something for anxiety. She appeared relieved to learn she would be getting scheduled Klonopin. Pt voiced eagerness to return home to her children.  A: Meds given as ordered. Q15 safety checks maintained. Support/encouragement offered. R: Pt remains free from harm and continues with treatment. Will continue to monitor for needs/safety.

## 2015-09-18 NOTE — Progress Notes (Signed)
Patient ID: Natalie Barrera, female   DOB: September 30, 1975, 40 y.o.   MRN: [id number] D: Client seen in dayroom watching TV, later playing cards. Client reports of her day "good" anxiety "6" of 10. Goal for treatment "clear my mind, get back on track" A: Writer provided emotional support. Medications reviewed, administered as ordered. R: Client is safe on the unit, attended group.

## 2015-09-18 NOTE — Progress Notes (Signed)
  D: Pt was tearful and crying while talking to the Clinical research associate. Informed the writer that her husband had just informed her that he'd been cheating for 3 months. Pt also informed the writer that she's pregnant. Stated, "that makes it even worse". Pt still having self harm thoughts, but contracts for safety. Pt has no questions or concerns.   A:  Support and encouragement was offered. 15 min checks continued for safety.  R: Pt remains safe.

## 2015-09-18 NOTE — Progress Notes (Signed)
[facility] MD Progress Note  09/18/2015 2:36 PM Natalie Barrera  MRN:  [id number] Subjective:   She reports feeling " a little better", although continues to feel depressed. She states she found out that her husband has been unfaithful and states that she is planning on going to live with her sister on discharge, rather than returning home. She reports sadness and anxiety, but states she is feeling " a little better than when I came in". Describes ongoing insomnia, and does not feel Benadryl worked to improve sleep.  She denies any suicidal ideations. At this time denies any medication side effects. Objective : I have discussed case with treatment team and have met with patient. Patient presents depressed, sad, and intermittently tearful, although affect is more reactive than on admission and  Smiles at times appropriately. She also better eye contact and speech is improved, less soft. Patient states she agrees she needs to be in the hospital at this time to get " the treatment I need" , but states she is hoping to discharge soon, because " I am really missing my children".  She is denying medication side effects at this time. She is visible on unit, going to some groups, sitting in day room, although interaction with peers is limited . No agitated or disruptive behaviors on unit, denies any current suicidal plan or intention and contracts for safety on the unit . Of note, her quantitative serum HCG is negative . As noted patient denies amenorrhea.  TSH WNL.  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 MDD (major depressive disorder), recurrent episode, severe ([place]) [F33.2] 09/16/2015  . Contracture of right hip [M24.551] 09/12/2015  . Chronic pain syndrome [G89.4] 08/12/2015  . Myofascial pain on right side [M79.1] 08/12/2015  . Trochanteric bursitis of right hip [M70.61] 08/12/2015  . Chronic  hip pain [M25.559, G89.29] 08/12/2015   Total Time spent with patient: 25 minutes     Past Medical History:  Past Medical History  Diagnosis Date  . Anxiety   . Ectopic pregnancy   . Chronic UTI (urinary tract infection)   . Depression     Past Surgical History  Procedure Laterality Date  . Cesarean section    . Salpingectomy      LEFT SIDE   Family History:  Family History  Problem Relation Age of Onset  . Heart failure Father     Social History:  History  Alcohol Use No     History  Drug Use No    Social History   Social History  . Marital Status: Married    Spouse Name: N/A  . Number of Children: N/A  . Years of Education: N/A   Social History Main Topics  . Smoking status: Current Every Day Smoker -- 1.00 packs/day for 18 years    Types: Cigarettes  . Smokeless tobacco: Never Used  . Alcohol Use: No  . Drug Use: No  . Sexual Activity: Yes    Birth Control/ Protection: None   Other Topics Concern  . None   Social History Narrative   Additional Social History:    Pain Medications: see mar Prescriptions: see mar Over the Counter: see mar History of alcohol / drug use?: No history of alcohol / drug abuse  Sleep: Fair  Appetite:  improved   Current Medications: Current Facility-Administered Medications  Medication Dose Route Frequency Provider Last Rate Last  Dose  . acetaminophen (TYLENOL) tablet 650 mg  650 mg Oral Q6H PRN Laverle Hobby, PA-C      . alum & mag hydroxide-simeth (MAALOX/MYLANTA) 200-200-20 MG/5ML suspension 30 mL  30 mL Oral Q4H PRN Laverle Hobby, PA-C   30 mL at 09/17/15 1201  . clonazePAM (KLONOPIN) tablet 0.5 mg  0.5 mg Oral BID Jenne Campus, MD   0.5 mg at 09/18/15 0804  . FLUoxetine (PROZAC) capsule 20 mg  20 mg Oral Daily Jenne Campus, MD   20 mg at 09/18/15 0804  . Influenza vac split quadrivalent PF (FLUARIX) injection 0.5 mL  0.5 mL Intramuscular Tomorrow-1000 [place] A Aliese Brannum, MD   0.5 mL at 09/17/15 1013   . magnesium hydroxide (MILK OF MAGNESIA) suspension 30 mL  30 mL Oral Daily PRN Laverle Hobby, PA-C      . nicotine (NICODERM CQ - dosed in mg/24 hours) patch 21 mg  21 mg Transdermal Daily Myer Peer Shanikia Kernodle, MD   21 mg at 09/18/15 0800  . pneumococcal 23 valent vaccine (PNU-IMMUNE) injection 0.5 mL  0.5 mL Intramuscular Tomorrow-1000 Myer Peer Tully Burgo, MD   0.5 mL at 09/17/15 1015  . traZODone (DESYREL) tablet 50 mg  50 mg Oral QHS PRN Jenne Campus, MD        Lab Results:  Results for orders placed or performed during the hospital encounter of 09/16/15 (from the past 48 hour(s))  hCG, quantitative, pregnancy     Status: None   Collection Time: 09/17/15  6:50 PM  Result Value Ref Range   hCG, Beta Chain, Quant, S <1 <5 mIU/mL    Comment:          GEST. AGE      CONC.  (mIU/mL)   <=1 WEEK        5 - 50     2 WEEKS       50 - 500     3 WEEKS       100 - 10,000     4 WEEKS     1,000 - 30,000     5 WEEKS     3,500 - 115,000   6-8 WEEKS     12,000 - 270,000    12 WEEKS     15,000 - 220,000        FEMALE AND NON-PREGNANT FEMALE:     LESS THAN 5 mIU/mL Performed at [facility]   TSH     Status: None   Collection Time: 09/17/15  6:50 PM  Result Value Ref Range   TSH 1.179 0.350 - 4.500 uIU/mL    Comment: Performed at [facility]    Blood Alcohol level:  Lab Results  Component Value Date   [facility] <5 09/16/2015    Physical Findings: AIMS: Facial and Oral Movements Muscles of Facial Expression: None, normal Lips and Perioral Area: None, normal Jaw: None, normal Tongue: None, normal,Extremity Movements Upper (arms, wrists, hands, fingers): None, normal Lower (legs, knees, ankles, toes): None, normal, Trunk Movements Neck, shoulders, hips: None, normal, Overall Severity Severity of abnormal movements (highest score from questions above): None, normal Incapacitation due to abnormal movements: None, normal Patient's awareness of abnormal  movements (rate only patient's report): No Awareness, Dental Status Current problems with teeth and/or dentures?: No Does patient usually wear dentures?: No  CIWA:  CIWA-Ar Total: 2 COWS:  COWS Total Score: 1  Musculoskeletal: Strength & Muscle Tone: within normal limits Gait &  Station: normal Patient leans: N/A  Psychiatric Specialty Exam: ROS no headache, no chest pain, no SOB< no vomiting, no rash   Blood pressure 107/74, pulse 105, temperature 97.7 F (36.5 C), temperature source Oral, resp. rate 18, height '5\' 2"'$  (1.575 m), weight 170 lb (77.111 kg), last menstrual period 08/30/2015.Body mass index is 31.09 kg/(m^2).  General Appearance: Fairly Groomed  Engineer, water::  fair but improved compared to admission  Speech:  Normal Rate  Volume:  less soft   Mood:  Depressed  Affect:  remains constricted and tearful at times, but does smile at times appropriately and overall presents with a more reactive affect   Thought Process:  Linear  Orientation:  Full (Time, Place, and Person)  Thought Content:  denies hallucinations, no delusions, not internally preoccupied , ruminative about marital stressors   Suicidal Thoughts:  Yes.  without intent/plan denies however, any actual plan or intention of hurting self or of SI, and contracts for safety on the unit, states she needs to continue to live in order to help and support her children  Homicidal Thoughts:  No specifically also  denies any homicidal or violent ideations towards husband   Memory:  recent and remote grossly intact   Judgement:  Other:  improving   Insight:  improving   Psychomotor Activity:  Decreased  Concentration:  Good  Recall:  Good  Fund of Knowledge:Good  Language: Good  Akathisia:  Negative  Handed:  Right  AIMS (if indicated):     Assets:  Desire for Improvement Resilience  ADL's:  Intact  Cognition: WNL  Sleep:  Number of Hours: 4.75  Assessment- patient presents with a partially improved range of affect,  but still depressed, constricted, ruminative. States she found out that her husband has been unfaithful and at this time she states she is going to go live with sister after discharging . Today  Endorses some passive thoughts of death, but denies SI, and identifies children as protective factor from harming self, also denies  any HI. Tolerating medications well thus far . Sleep remains fair in spite of Benadryl, wants to try Trazodone .  Follow up serum HCG negative . Treatment Plan Summary: Daily contact with patient to assess and evaluate symptoms and progress in treatment, Medication management, Plan inpatient treatment ' and  medications as below  Continue to encourage milieu and group participation to work on coping skills and symptom reduction Continue  Prozac 20 mgrs QDAY for depression and anxiety Continue Klonopin 0.5 mgrs BID for anxiety  D/C Benadryl Start Trazodone 50 mgrs QHS PRN for insomnia  Treatment team working on disposition planning  Neita Garnet, MD 09/18/2015, 2:36 PM
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
## 2015-09-18 NOTE — B[facility] Group Notes (Signed)
[facility] LCSW Group Therapy 09/18/2015 1:15 PM Type of Therapy: Group Therapy Participation Level: Active  Participation Quality: Attentive, Sharing and Supportive  Affect: blunted, anxious  Cognitive: Alert and Oriented  Insight: Developing/Improving and Engaged  Engagement in Therapy: Developing/Improving and Engaged  Modes of Intervention: Activity, Clarification, Confrontation, Discussion, Education, Exploration, Limit-setting, Orientation, Problem-solving, Rapport Building, Dance movement psychotherapist, Socialization and Support  Summary of Progress/Problems: Patient was attentive and engaged with speaker from [employer]. Patient was attentive to speaker while they shared their story of dealing with mental health and overcoming it. Patient expressed interest in their programs and services and received information on their agency. Patient processed ways they can relate to the speaker.   Samuella Bruin, LCSW Clinical Social Worker [facility] [phone]

## 2015-09-18 NOTE — Progress Notes (Signed)
Adult Psychoeducational Group Note  Date:  09/18/2015 Time:  8:15 PM  Group Topic/Focus:  Wrap-Up Group:   The focus of this group is to help patients review their daily goal of treatment and discuss progress on daily workbooks.  Participation Level:  Active  Participation Quality:  Appropriate  Affect:  Appropriate  Cognitive:  Appropriate  Insight: Appropriate  Engagement in Group:  Engaged  Modes of Intervention:  Discussion  Additional Comments:  Pt was pleasant during wrap-up group. Pt rated her overall day a 7 out of 10 because she got to talk to her children today. Pt reported that she did not have a goal for the day.   [name] 09/18/2015, 8:34 PM

## 2015-09-18 NOTE — [facility] Group Notes (Signed)
[facility] Group Notes:  (Nursing/MHT/Case Management/Adjunct)  Date:  09/18/2015  Time:  9:08 AM  Type of Therapy:  Nurse Education  Participation Level:  Active  Participation Quality:  Appropriate  Affect:  Appropriate  Cognitive:  Appropriate  Insight:  Appropriate  Engagement in Group:  Engaged  Modes of Intervention:  Discussion, Education and Support  Summary of Progress/Problems:  Kristyn was focused on discharge and could identify no other goal.  Maurine Simmering 09/18/2015, 9:08 AM

## 2015-09-19 DIAGNOSIS — F332 Major depressive disorder, recurrent severe without psychotic features: Secondary | ICD-10-CM

## 2015-09-19 MED ORDER — NICOTINE 21 MG/24HR TD PT24: 21.0000 mg | MEDICATED_PATCH | Freq: Every day | TRANSDERMAL | Status: AC

## 2015-09-19 MED ORDER — TRAZODONE HCL 100 MG PO TABS: 100.0000 mg | ORAL_TABLET | Freq: Every evening | ORAL | Status: AC | PRN

## 2015-09-19 MED ORDER — FLUOXETINE HCL 20 MG PO CAPS: 20.0000 mg | ORAL_CAPSULE | Freq: Every day | ORAL | Status: AC

## 2015-09-19 MED ORDER — TRAZODONE HCL 100 MG PO TABS: 100.0000 mg | ORAL_TABLET | Freq: Every evening | ORAL | Status: DC | PRN

## 2015-09-19 NOTE — [facility] Group Notes (Signed)
   [facility] Note  09/19/2015  8:45 AM   Participation Quality: Alert, Appropriate and Oriented  Mood/Affect: Blunted  Depression Rating: 3  Anxiety Rating: 7  Thoughts of Suicide: Pt denies SI/HI  Will you contract for safety? Yes  Current AVH: Pt denies  Plan for Discharge/Comments: Pt attended discharge planning group and actively participated in group. CSW provided pt with today's workbook. Patient reports feeling safe to discharge home today to follow up with outpatient services.  Transportation Means: Pt reports access to transportation  Supports: No supports mentioned at this time  Samuella BruinKristin Lasya Vetter, MSW, Johnson & JohnsonLCSW Clinical Social Worker [facility] [phone]

## 2015-09-19 NOTE — Progress Notes (Signed)
Discharge Note:  Patient discharged home, car in parking lot.  Patient denied SI and HI.  Denied A/V hallucinations.  Suicide prevention information given and discussed with patient who stated she understood and had no questions.  Patient stated she received all her belongings, clothing, misc items, prescriptions, jewelry, keys, cards, cell phone, [place], debit card, EBT card, etc.  Patient stated she appreciated all assistance received from [facility] staff.  All required discharge information was given to patient at discharge [facility].

## 2015-09-19 NOTE — Progress Notes (Signed)
Recreation Therapy Notes  Date: 03.03.2017 Time: 9:30am Location: [address] Dayroom   Group Topic: Stress Management  Goal Area(s) Addresses:  Patient will actively participate in stress management techniques presented during session.   Behavioral Response: Did not attend.   Amita Atayde L Nancy Manuele, LRT/CTRS        Delcia Spitzley L 09/19/2015 10:23 AM 

## 2015-09-19 NOTE — [facility] Suicide Risk Assessment (Signed)
[facility] Discharge Suicide Risk Assessment   Principal Problem:  Major Depression, Recurrent, Severe  Discharge Diagnoses:  Patient Active Problem List   Diagnosis Date Noted  . MDD (major depressive disorder), recurrent episode, severe (HCC) [F33.2] 09/16/2015  . Contracture of right hip [M24.551] 09/12/2015  . Chronic pain syndrome [G89.4] 08/12/2015  . Myofascial pain on right side [M79.1] 08/12/2015  . Trochanteric bursitis of right hip [M70.61] 08/12/2015  . Chronic hip pain [M25.559, G89.29] 08/12/2015    Total Time spent with patient: 30 minutes  Musculoskeletal: Strength & Muscle Tone: within normal limits Gait & Station: normal Patient leans: N/A  Psychiatric Specialty Exam: ROS  Blood pressure 101/70, pulse 118, temperature 99 F (37.2 C), temperature source Oral, resp. rate 16, height  (1.575 m), weight 170 lb (77.111 kg), last menstrual period 08/30/2015.Body mass index is 31.09 kg/(m^2).  General Appearance: Well Groomed  Eye Contact::  Good  Speech:  Normal Rate409  Volume:  Normal  Mood:  improved mood , feels less depressed   Affect:  Appropriate and more reactive   Thought Process:  Linear  Orientation:  Full (Time, Place, and Person)  Thought Content:  denies hallucinations, no delusions   Suicidal Thoughts:  No denies any suicidal ideations, denies any self injurious ideations, denies any homicidal ideations, also specifically denies any thoughts of hurting her husband   Homicidal Thoughts:  No  Memory:  recent and remote grossly intact   Judgement:  Other:  improved   Insight:  improving   Psychomotor Activity:  Normal  Concentration:  Good  Recall:  Good  Fund of Knowledge:Good  Language: Good  Akathisia:  Negative  Handed:  Right  AIMS (if indicated):     Assets:  Communication Skills Desire for Improvement Resilience  Sleep:  Number of Hours: 5.5  Cognition: WNL  ADL's:  Intact   Mental Status Per Nursing Assessment::   On Admission:   Suicidal ideation indicated by patient  Demographic Factors:  40 year old married female, four children  Loss Factors: Marital conflict, states she recently found out her husband had cheated ,  Death of mother 5 years ago  Historical Factors: History of depression , prior psychiatric admissions, history of suicide ideations   Risk Reduction Factors:   Responsible for children under 60 years of age, Sense of responsibility to family, Living with another person, especially a relative, Positive social support and Positive coping skills or problem solving skills  Continued Clinical Symptoms:  At this time patient is improved compared to admission, presents fully alert and attentive, well related, pleasant, mood improved, less depressed, affect more reactive, smiles at times appropriately, no thought disorder, no suicidal or homicidal ideations, no psychotic symptoms, future oriented   Cognitive Features That Contribute To Risk:  No gross cognitive deficits noted upon discharge. Is alert , attentive, and oriented x 3   Suicide Risk:  Mild:  Suicidal ideation of limited frequency, intensity, duration, and specificity.  There are no identifiable plans, no associated intent, mild dysphoria and related symptoms, good self-control (both objective and subjective assessment), few other risk factors, and identifiable protective factors, including available and accessible social support.  Follow-up Information    Follow up with [facility].   Why:  Medication management appointment on Thursday March 30th at 2pm. Call office if you need to reschedule.    Contact information:   [address] [place], [place] [address] [phone]      Follow up with [facility].   Why:  Therapy appt with Rudi RummageSharon Burkett on Tuesday March 7th at [facility]. Please bring insurance card to appt. Call office if you need to reschedule.   Contact information:   [facility] [address]  [place], [place] [address] [phone]      Plan Of Care/Follow-up recommendations:  Activity:  as tolerated  Diet:  Regular Tests:  NA Other:  See below  Patient is requesting discharge and at this time there are no grounds for involuntary commitment  She is planning to go live with her sister, whom she describes as very supportive Follow up as above  Nehemiah MassedOBOS, Karee Christopherson, MD 09/19/2015, 10:21 AM

## 2015-09-19 NOTE — Progress Notes (Signed)
  [facility]  Will you be returning to the same living situation after discharge:  Yes,  patient plans to return home At discharge, do you have transportation home?: Yes,  patient's car is in the parking lot Do you have the ability to pay for your medications: Yes,  patient will be provided with prescriptions at discharge  Release of information consent forms completed and in the chart;  Patient's signature needed at discharge.  Patient to Follow up at: Follow-up Information    Follow up with [facility].   Why:  Medication management appointment on Thursday March 30th at 2pm. Call office if you need to reschedule.    Contact information:   [address] [place], [place] [address] [phone]      Follow up with [facility].   Why:  Therapy appt with Rudi RummageSharon Burkett on Tuesday March 7th at [facility]9am. Please bring insurance card to appt. Call office if you need to reschedule.   Contact information:   [facility] [address]  [place], [place] [address] [phone]      Next level of care provider has [facility] Link:no  Safety Planning and Suicide Prevention discussed: Yes,  with patient  Have you used any form of tobacco in the last 30 days? (Cigarettes, Smokeless Tobacco, Cigars, and/or Pipes): Yes  Has patient been referred to the Quitline?: Patient refused referral  Patient has been referred for addiction treatment: N/A  Haim Hansson, [place] CarboKristin L 09/19/2015, 10:42 AM

## 2015-09-19 NOTE — Discharge Summary (Signed)
Physician Discharge Summary Note  Patient:  Natalie Barrera is an 40 y.o., female MRN:  [id number] DOB:  13-Dec-1975 Patient phone:  [phone] (home)  Patient address:   [address] [place] [address],  Total Time spent with patient: 30 minutes  Date of Admission:  09/16/2015 Date of Discharge:09/19/2015  Reason for Admission:  40 year old married woman, who overdosed on tylenol ( states she took about 15 tablets ) impulsively after an argument with her husband during which he said he wanted a divorce. This occurred two days ago. States that her overdose was witnessed by family and she was brought to the hospital.  States she was already feeling depressed , because her mother's anniversary of death is on February 20th ( she passed away 5 years ago )  She describes neuro-vegetative symptoms as below. She denies any psychotic symptoms . Patient also endorses significant anxiety, mainly excessive worrying , but also panic attacks and describes agoraphobia . States she does not like leaving house or going to public, crowded places .  Principal Problem: Severe episode of recurrent major depressive disorder, without psychotic features [facility](HCC) Discharge Diagnoses: Patient Active Problem List   Diagnosis Date Noted  . Severe episode of recurrent major depressive disorder, without psychotic features (HCC) [F33.2]   . MDD (major depressive disorder), recurrent episode, severe (HCC) [F33.2] 09/16/2015  . Contracture of right hip [M24.551] 09/12/2015  . Chronic pain syndrome [G89.4] 08/12/2015  . Myofascial pain on right side [M79.1] 08/12/2015  . Trochanteric bursitis of right hip [M70.61] 08/12/2015  . Chronic hip pain [M25.559, G89.29] 08/12/2015    Past Psychiatric History: MDD; severe recurrent,  Chronic pain  Past Medical History:  Past Medical History  Diagnosis Date  . Anxiety   . Ectopic pregnancy   . Chronic UTI (urinary tract infection)   . Depression     Past  Surgical History  Procedure Laterality Date  . Cesarean section    . Salpingectomy      LEFT SIDE   Family History:  Family History  Problem Relation Age of Onset  . Heart failure Father    Family Psychiatric  History: See HPI Social History:  History  Alcohol Use No     History  Drug Use No    Social History   Social History  . Marital Status: Married    Spouse Name: N/A  . Number of Children: N/A  . Years of Education: N/A   Social History Main Topics  . Smoking status: Current Every Day Smoker -- 1.00 packs/day for 18 years    Types: Cigarettes  . Smokeless tobacco: Never Used  . Alcohol Use: No  . Drug Use: No  . Sexual Activity: Yes    Birth Control/ Protection: None   Other Topics Concern  . None   Social History Narrative    Hospital Course:  Natalie Barrera was admitted for <principal problem not specified> and crisis management.  She was treated with the following medications Prozac 20 mgrs QDAY for depression and anxiety, Klonopin 0.5 mgrs BID for anxiety,   Trazodone 50 mgrs QHS PRN for insomnia .  Natalie Barrera was discharged with current medication and was instructed on how to take medications as prescribed; (details listed below under Medication List).  Medical problems were identified and treated as needed.  Home medications were restarted as appropriate.  Improvement was monitored by observation and Natalie Barrera daily report of symptom reduction.  Emotional and mental status was  monitored by daily self-inventory reports completed by Natalie Barrera and clinical staff.         Natalie Barrera was evaluated by the treatment team for stability and plans for continued recovery upon discharge.  Natalie Barrera motivation was an integral factor for scheduling further treatment.  Employment, transportation, bed availability, health status, family support, and any pending legal issues were also considered during her hospital stay.  She was  offered further treatment options upon discharge including but not limited to Residential, Intensive Outpatient, and Outpatient treatment.  Natalie Barrera will follow up with the services as listed below under Follow Up Information.   Upon completion of this admission the Natalie Barrera was both mentally and medically stable for discharge denying suicidal/homicidal ideation, auditory/visual/tactile hallucinations, delusional thoughts and paranoia.     Physical Findings: AIMS: Facial and Oral Movements Muscles of Facial Expression: None, normal Lips and Perioral Area: None, normal Jaw: None, normal Tongue: None, normal,Extremity Movements Upper (arms, wrists, hands, fingers): None, normal Lower (legs, knees, ankles, toes): None, normal, Trunk Movements Neck, shoulders, hips: None, normal, Overall Severity Severity of abnormal movements (highest score from questions above): None, normal Incapacitation due to abnormal movements: None, normal Patient's awareness of abnormal movements (rate only patient's report): No Awareness, Dental Status Current problems with teeth and/or dentures?: No Does patient usually wear dentures?: No  CIWA:  CIWA-Ar Total: 1 COWS:  COWS Total Score: 3  Musculoskeletal: Strength & Muscle Tone: within normal limits Gait & Station: normal Patient leans: N/A  Psychiatric Specialty Exam: ROS  Blood pressure 104/69, pulse 109, temperature 99 F (37.2 C), temperature source Oral, resp. rate 16, height  (1.575 m), weight 77.111 kg (170 lb), last menstrual period 08/30/2015.Body mass index is 31.09 kg/(m^2).   Have you used any form of tobacco in the last 30 days? (Cigarettes, Smokeless Tobacco, Cigars, and/or Pipes): Yes  Has this patient used any form of tobacco in the last 30 days? (Cigarettes, Smokeless Tobacco, Cigars, and/or Pipes) Yes, Yes, A prescription for an FDA-approved tobacco cessation medication was offered at discharge and the patient  refused  Blood Alcohol level:  Lab Results  Component Value Date   ETH <5 09/16/2015    Metabolic Disorder Labs:  No results found for: HGBA1C, MPG No results found for: PROLACTIN No results found for: CHOL, TRIG, HDL, CHOLHDL, VLDL, LDLCALC  See Psychiatric Specialty Exam and Suicide Risk Assessment completed by Attending Physician prior to discharge.  Discharge destination:  Other:  Sister house.   Is patient on multiple antipsychotic therapies at discharge:  No   Has Patient had three or more failed trials of antipsychotic monotherapy by history:  No  Recommended Plan for Multiple Antipsychotic Therapies: NA     Medication List    STOP taking these medications        ALPRAZolam 0.5 MG tablet  Commonly known as:  XANAX     cyclobenzaprine 5 MG tablet  Commonly known as:  FLEXERIL     diphenhydramine-acetaminophen 25-500 MG Tabs tablet  Commonly known as:  TYLENOL PM     LUNESTA 1 MG Tabs tablet  Generic drug:  eszopiclone     tizanidine 2 MG capsule  Commonly known as:  ZANAFLEX      TAKE these medications      Indication   FLUoxetine 20 MG capsule  Commonly known as:  PROZAC  Take 1 capsule (20 mg total) by mouth daily.   Indication:  Major Depressive  Disorder     nicotine 21 mg/24hr patch  Commonly known as:  NICODERM CQ - dosed in mg/24 hours  Place 1 patch (21 mg total) onto the skin daily.   Indication:  Nicotine Addiction     traZODone 100 MG tablet  Commonly known as:  DESYREL  Take 1 tablet (100 mg total) by mouth at bedtime as needed for sleep.   Indication:  Trouble Sleeping           Follow-up Information    Follow up with [facility].   Why:  Medication management appointment on Thursday March 30th at 2pm. Call office if you need to reschedule.    Contact information:   [address] [place], [place] [address] [phone]      Follow up with [facility].   Why:  Therapy appt with  Rudi Rummage on Tuesday March 7th at [facility]. Please bring insurance card to appt. Call office if you need to reschedule.   Contact information:   [facility] [address]  [place], [place] [address] [phone]      Follow-up recommendations:  Activity:  Increase activity as tolerated Diet:  Regualr house diet Tests:  Recheck labs in 3 months   Comments:  Take all medications as prescribed. Keep all follow-up appointments as scheduled.  Do not consume alcohol or use illegal drugs while on prescription medications. Report any adverse effects from your medications to your primary care provider promptly.  In the event of recurrent symptoms or worsening symptoms, call 911, a crisis hotline, or go to the nearest emergency department for evaluation.   Signed: Truman Hayward, FNP 09/19/2015, 11:58 AM   Patient seen, Suicide Assessment Completed.  Disposition Plan Reviewed

## 2015-09-19 NOTE — Progress Notes (Signed)
D:  Patient's self inventory sheet, patient sleeps good after second medication.  Fair appetite, normal energy level, good concentration.  Rated depression 3, denied hopeless and anxiety #7.  Denied withdrawals.  Denied SI.  Denied physical problems.  Physical pain, worst pain #7 in past 24 hours, back pain.  Pain medication is not helpful.  Goal is to discharge home.  Does have discharge plan to go home and see babies. A:  Medications administered per MD orders.  Emotional support and encouragement given patient. R:  Denied SI and HI, contracts for safety.  Denied A/V hallucinations.  Safety maintained with 15 minute checks.

## 2015-09-21 ENCOUNTER — Inpatient Hospital Stay: Admission: RE | Admit: 2015-09-21 | Payer: Medicaid Other | Source: Ambulatory Visit

## 2015-09-25 ENCOUNTER — Other Ambulatory Visit: Payer: Self-pay | Admitting: *Deleted

## 2015-09-25 MED ORDER — TIZANIDINE HCL 2 MG PO CAPS: 2.0000 mg | ORAL_CAPSULE | Freq: Three times a day (TID) | ORAL | Status: DC

## 2015-09-26 ENCOUNTER — Encounter: Payer: Medicaid Other | Attending: Physical Medicine & Rehabilitation

## 2015-09-26 ENCOUNTER — Ambulatory Visit: Payer: Medicaid Other | Admitting: Physical Medicine & Rehabilitation

## 2015-09-26 DIAGNOSIS — Z79899 Other long term (current) drug therapy: Secondary | ICD-10-CM | POA: Insufficient documentation

## 2015-09-26 DIAGNOSIS — F419 Anxiety disorder, unspecified: Secondary | ICD-10-CM | POA: Insufficient documentation

## 2015-09-26 DIAGNOSIS — M25511 Pain in right shoulder: Secondary | ICD-10-CM | POA: Insufficient documentation

## 2015-09-26 DIAGNOSIS — M25551 Pain in right hip: Secondary | ICD-10-CM | POA: Insufficient documentation

## 2015-09-26 DIAGNOSIS — M545 Low back pain: Secondary | ICD-10-CM | POA: Insufficient documentation

## 2015-09-26 DIAGNOSIS — M791 Myalgia: Secondary | ICD-10-CM | POA: Insufficient documentation

## 2015-09-26 DIAGNOSIS — M7061 Trochanteric bursitis, right hip: Secondary | ICD-10-CM | POA: Insufficient documentation

## 2015-09-26 DIAGNOSIS — G894 Chronic pain syndrome: Secondary | ICD-10-CM | POA: Insufficient documentation

## 2015-09-26 DIAGNOSIS — Z5181 Encounter for therapeutic drug level monitoring: Secondary | ICD-10-CM | POA: Insufficient documentation

## 2015-09-26 DIAGNOSIS — F17210 Nicotine dependence, cigarettes, uncomplicated: Secondary | ICD-10-CM | POA: Insufficient documentation

## 2015-10-23 ENCOUNTER — Other Ambulatory Visit: Payer: Self-pay | Admitting: *Deleted

## 2015-10-23 MED ORDER — TIZANIDINE HCL 2 MG PO CAPS: 2.0000 mg | ORAL_CAPSULE | Freq: Three times a day (TID) | ORAL | Status: AC

## 2015-11-15 ENCOUNTER — Other Ambulatory Visit (HOSPITAL_COMMUNITY): Payer: Self-pay | Admitting: Psychiatry

## 2017-02-16 ENCOUNTER — Other Ambulatory Visit: Payer: Self-pay | Admitting: Physical Medicine & Rehabilitation

## 2017-11-17 IMAGING — CR DG HIP (WITH OR WITHOUT PELVIS) 2-3V*R*
3 series · 3 of 3 positions shown · non-contrast
Comparison: None.

CLINICAL DATA: Right hip pain for 2 months.

EXAM:
DG HIP (WITH OR WITHOUT PELVIS) 2-3V RIGHT

[w pelvis upright]
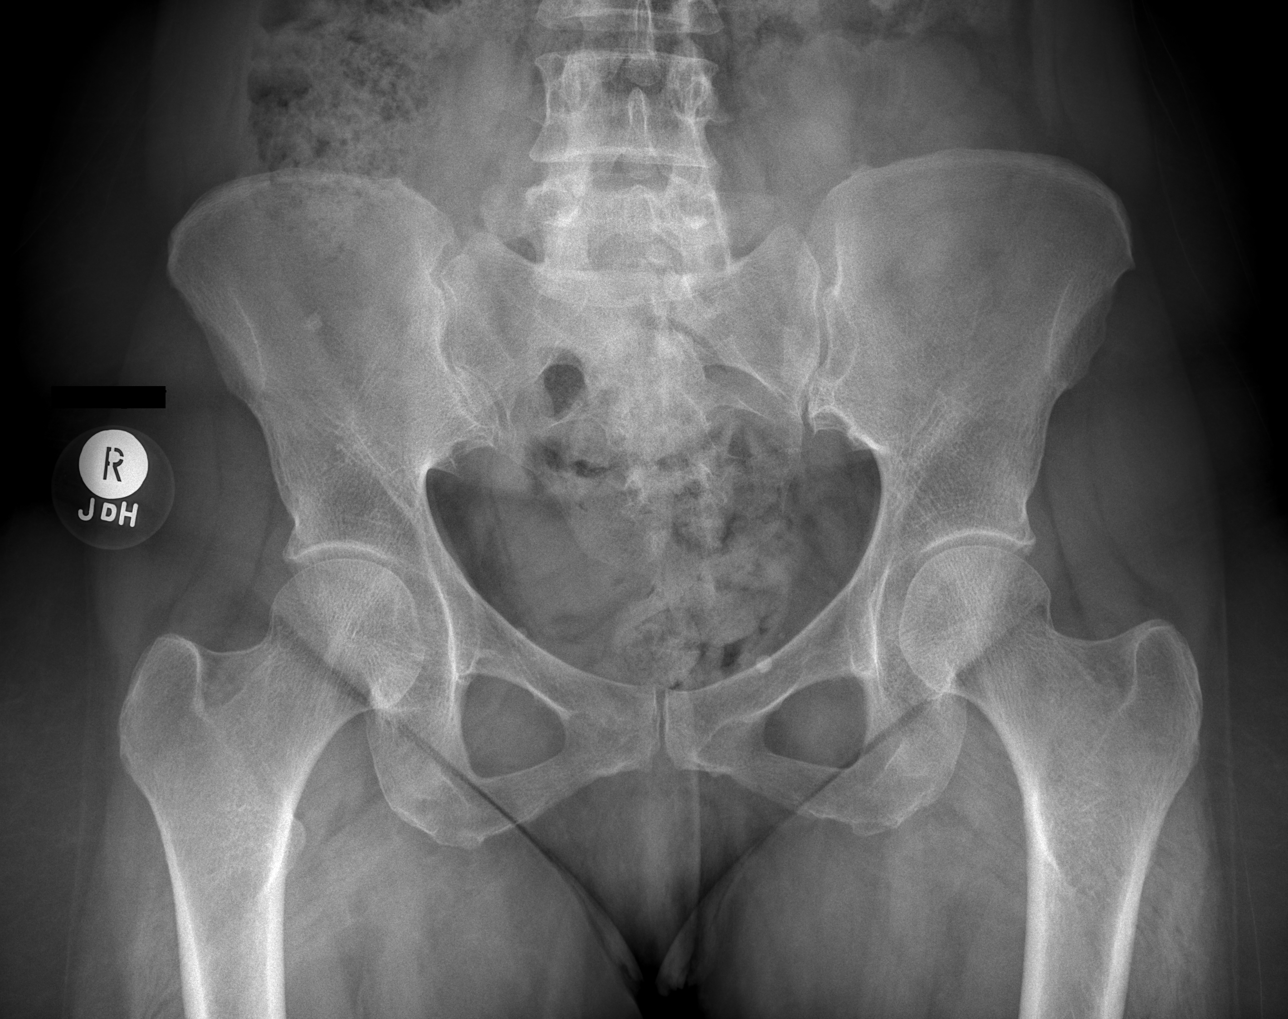

[w hip ap right]
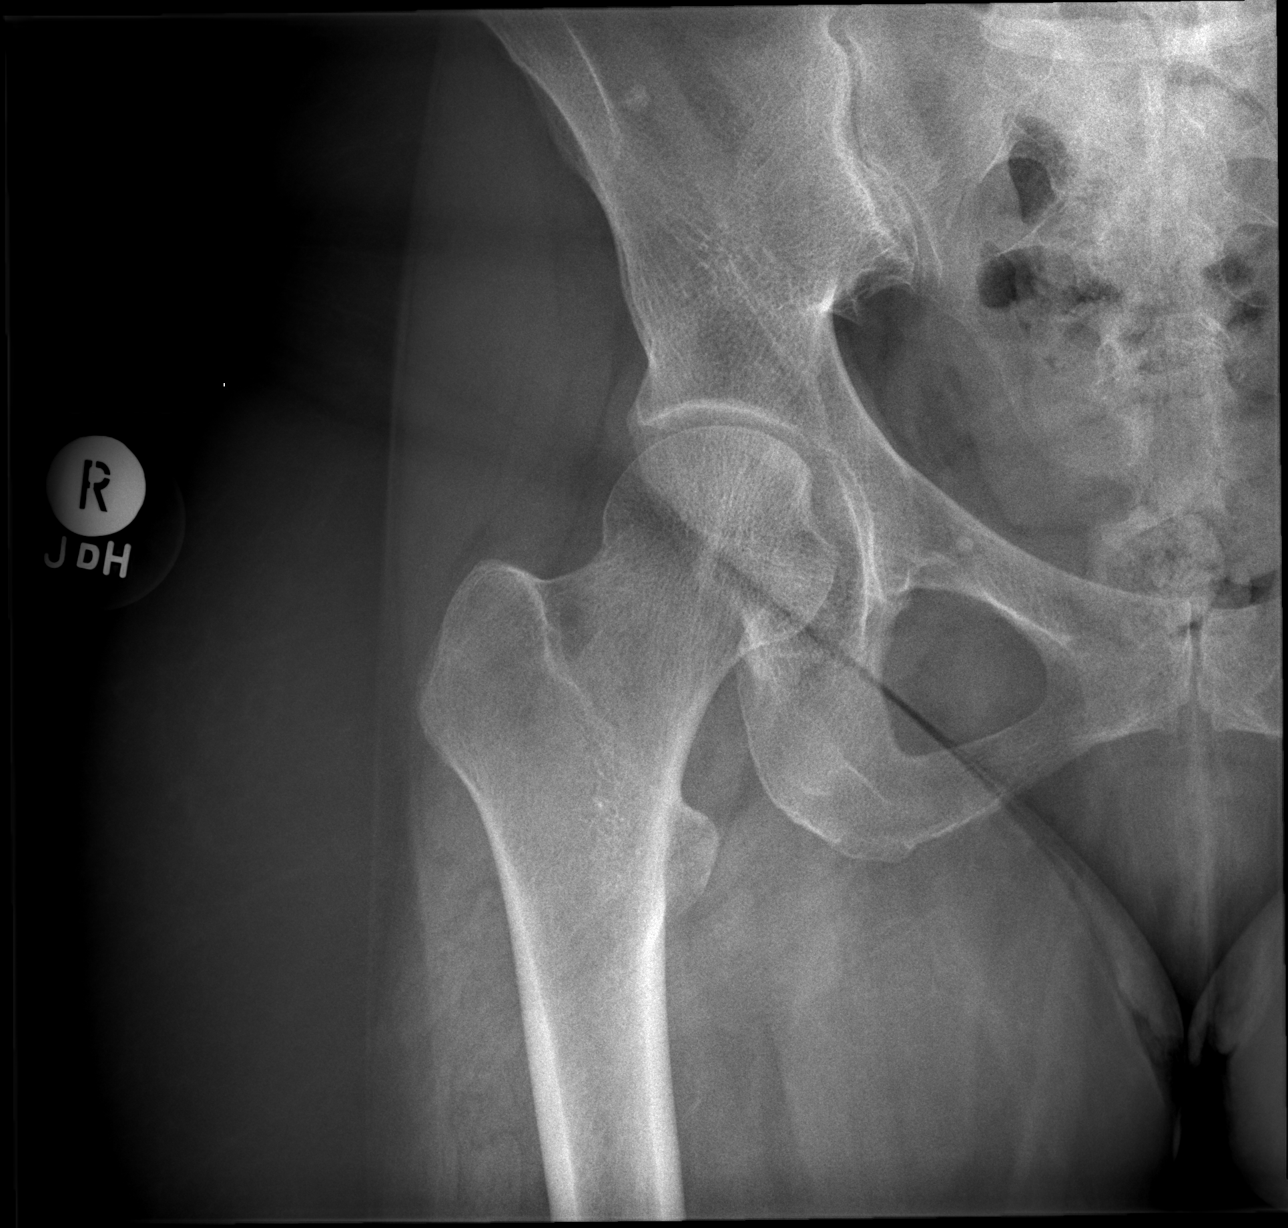

[w hip frog right]
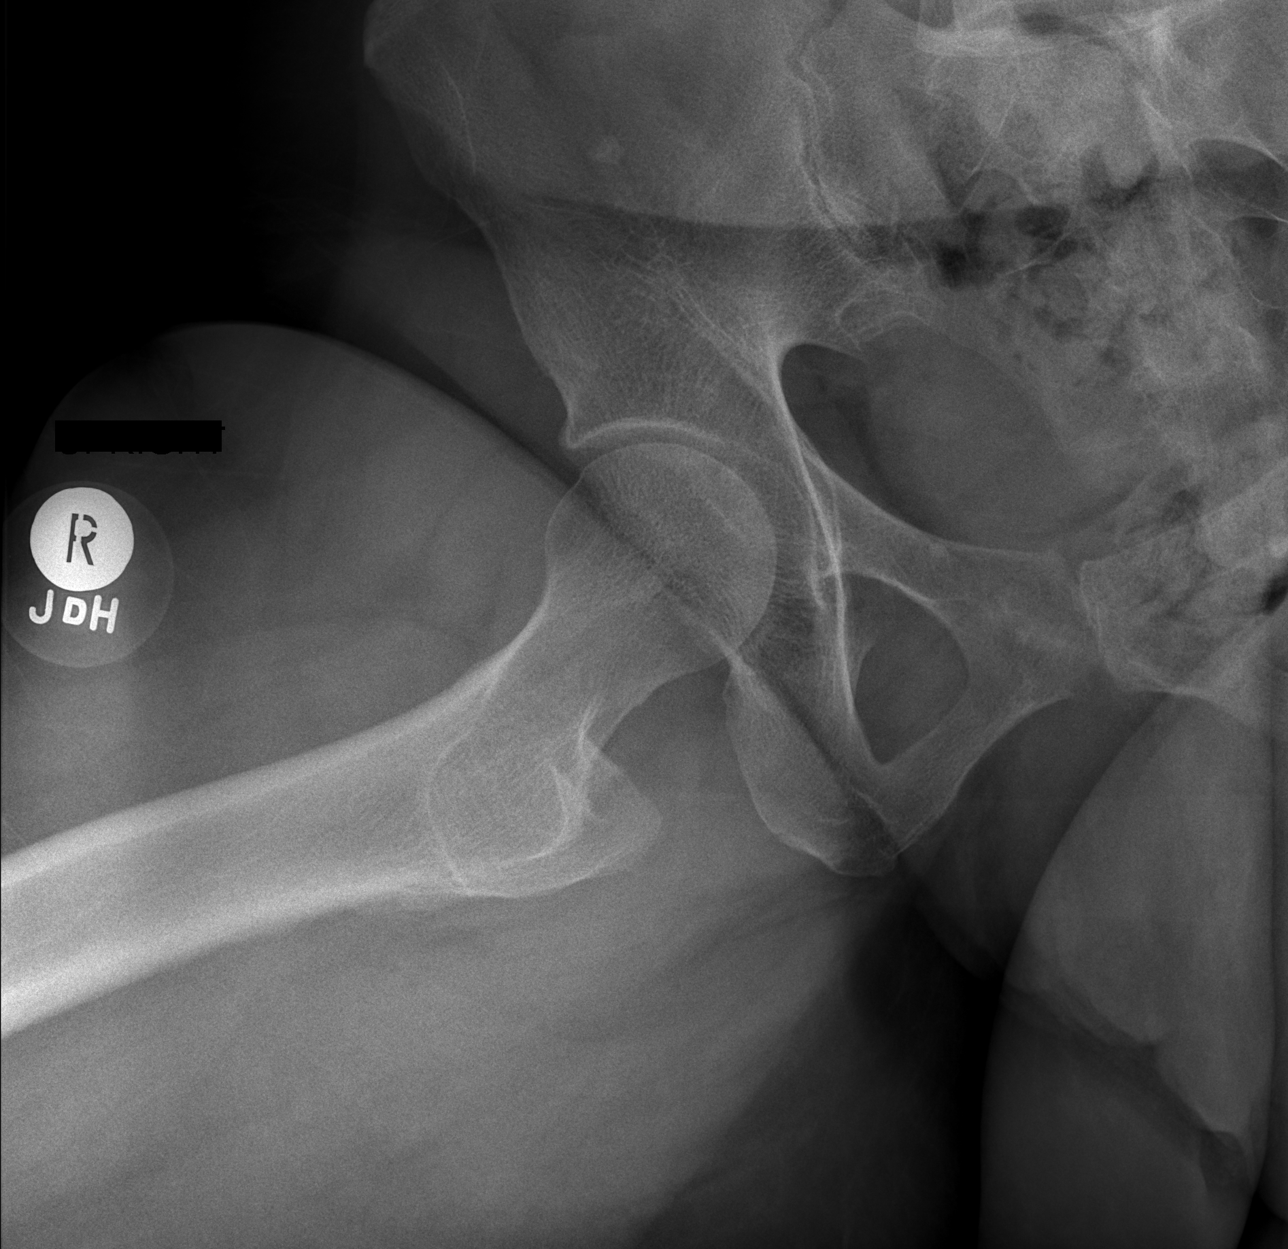

[3 of 3 positions shown; findings below may reference images not displayed]

FINDINGS: There is no evidence of hip fracture or dislocation. There is no
evidence of arthropathy or other focal bone abnormality.
IMPRESSION: Negative.
